# Patient Record
Sex: Male | Born: 1945
Health system: Southern US, Community
[De-identification: ages and names within clinical notes are randomized; demographics above are authoritative.]

## PROBLEM LIST (undated history)

## (undated) DIAGNOSIS — M1990 Unspecified osteoarthritis, unspecified site: Secondary | ICD-10-CM

## (undated) HISTORY — DX: Unspecified osteoarthritis, unspecified site: M19.90

---

## 2007-07-04 ENCOUNTER — Other Ambulatory Visit: Payer: Self-pay

## 2007-07-04 ENCOUNTER — Inpatient Hospital Stay: Payer: Self-pay | Admitting: Unknown Physician Specialty

## 2008-07-15 LAB — HM COLONOSCOPY

## 2009-05-27 IMAGING — NM NUCLEAR MEDICINE WHOLE BODY BONE SCINTIGRAPHY
4 series · 14 of 14 positions shown · non-contrast
Comparison: none

REASON FOR EXAM: Part 2
COMMENTS:  *** There is an active Isolation Contact on this patient. **

[Series 1000: 3 hr wholebody · 2.40mm/px · 2 of 2 frames shown]
[frame 1/2]
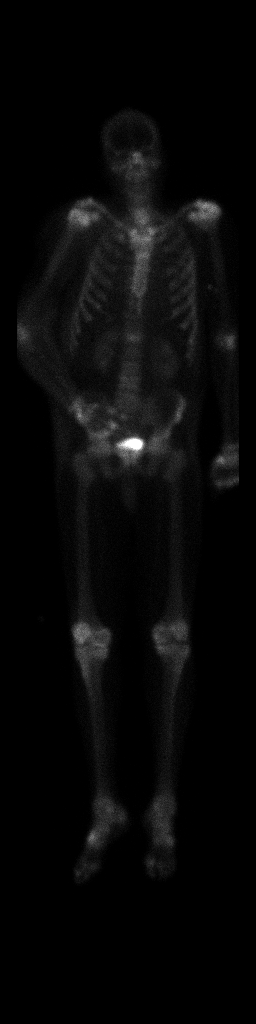
[frame 2/2]
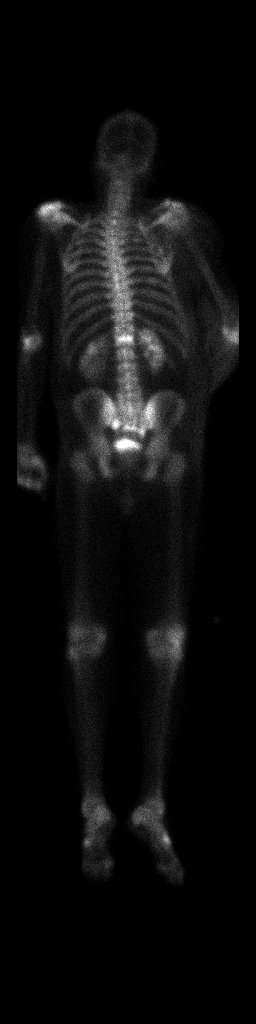

[Series 1000: statics · 2.40mm/px · 2 acquisitions, 4 frames shown]
[im 1/2]
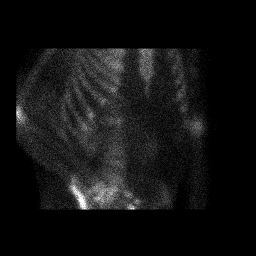
[im 1/2]
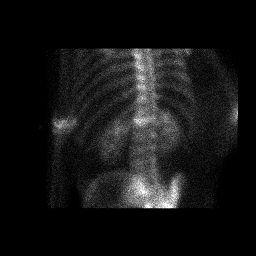
[im 2/2]
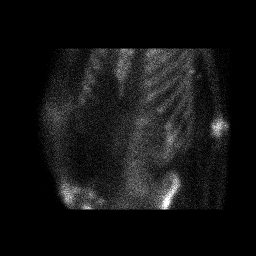
[im 2/2]
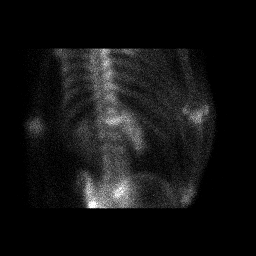

[Series 1000: immediate · 4.80mm/px · 2 of 2 frames shown]
[frame 1/2]
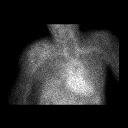
[frame 2/2]
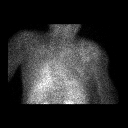

[Series 1000: flow · 4.80mm/px · 6 of 60 frames shown]
[frame 6/60]
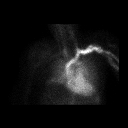
[frame 16/60]
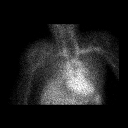
[frame 26/60]
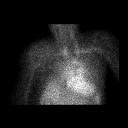
[frame 36/60]
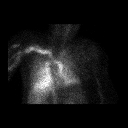
[frame 46/60]
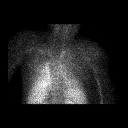
[frame 56/60]
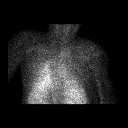

[14 of 14 positions shown; findings below may reference images not displayed]

PROCEDURE:     NM  - NM BONE WB 3 HR [DATE] [DATE]

RESULT:     Following intravenous administration of 21.09 mCi Technetium 99m
MDP, Three Phase Bone Scan of the shoulders was performed with a two hour
whole body scan obtained at three hours.

The three phase shoulder compartment shows symmetrical tracer activity in
the shoulders on the vascular and equilibrium phase views. On the delayed
scan, there is observed increased tracer activity on the LEFT in the region
of the acromion and lateral clavicle. The findings are nonspecific and could
be secondary to trauma or neoplasm. Infection is considered unlikely in view
of the absence of increased tracer activity on the arterial and equilibrium
phase views. There is a mild increase in tracer activity at the L1 level of
the lumbar spine. This finding is nonspecific. Degenerative change would be
the primary consideration but correlation with plain film radiographs would
be recommended. There is a very faint increase in tracer activity at the
costochondral junction of the first, RIGHT rib. This is thought to be of
doubtful clinical significance. Tracer activity is seen in both kidneys.
IMPRESSION: 1.  There is a mild increase in tracer activity at the LEFT shoulder in the
region of the acromion.
2.  There is increased tracer activity at the level of the L1 lumbar
vertebral body.
3.  There is a slight increase in tracer activity at the first, RIGHT rib
anteriorly.

## 2009-05-29 IMAGING — XA DG CHEST 1V
2 series · 2 of 2 positions shown · non-contrast
Comparison: none

REASON FOR EXAM: picc placement
COMMENTS:

PROCEDURE:     VAS - CHEST FRONTAL SINGLE VIEW  - July 09, 2007 [DATE]
RESULT:     AP chest was performed following placement of PICC line. The
PICC line is in good anatomic position.  The tip is in the region of the
superior vena cava.

[Series 1: run · 1 of 1 slices shown (1 of 2)]
[im 1/1]
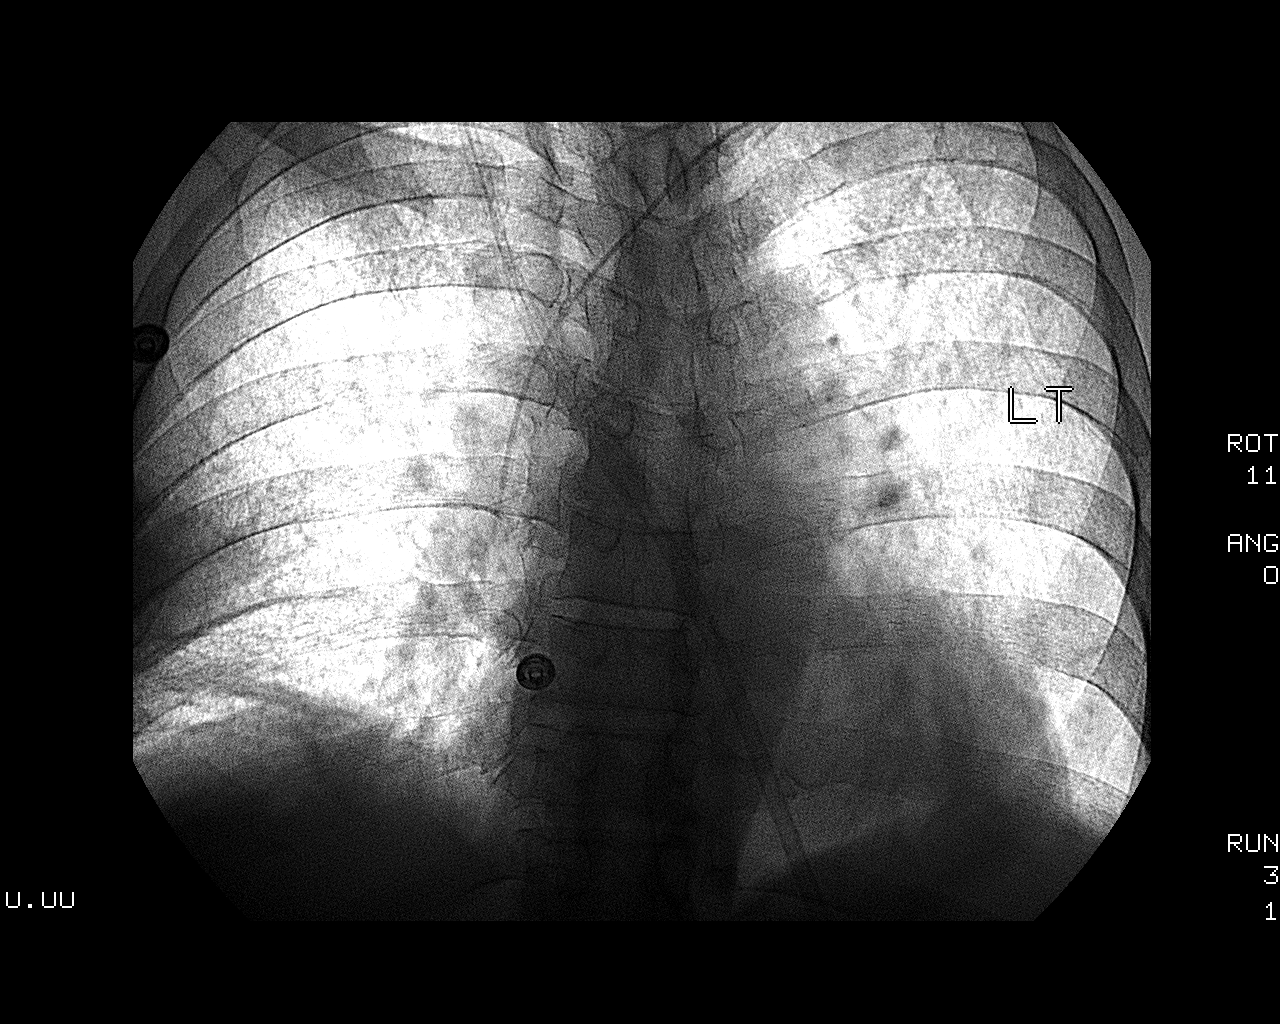

[Series 3: run · 1 of 1 slices shown (2 of 2)]
[im 1/1]
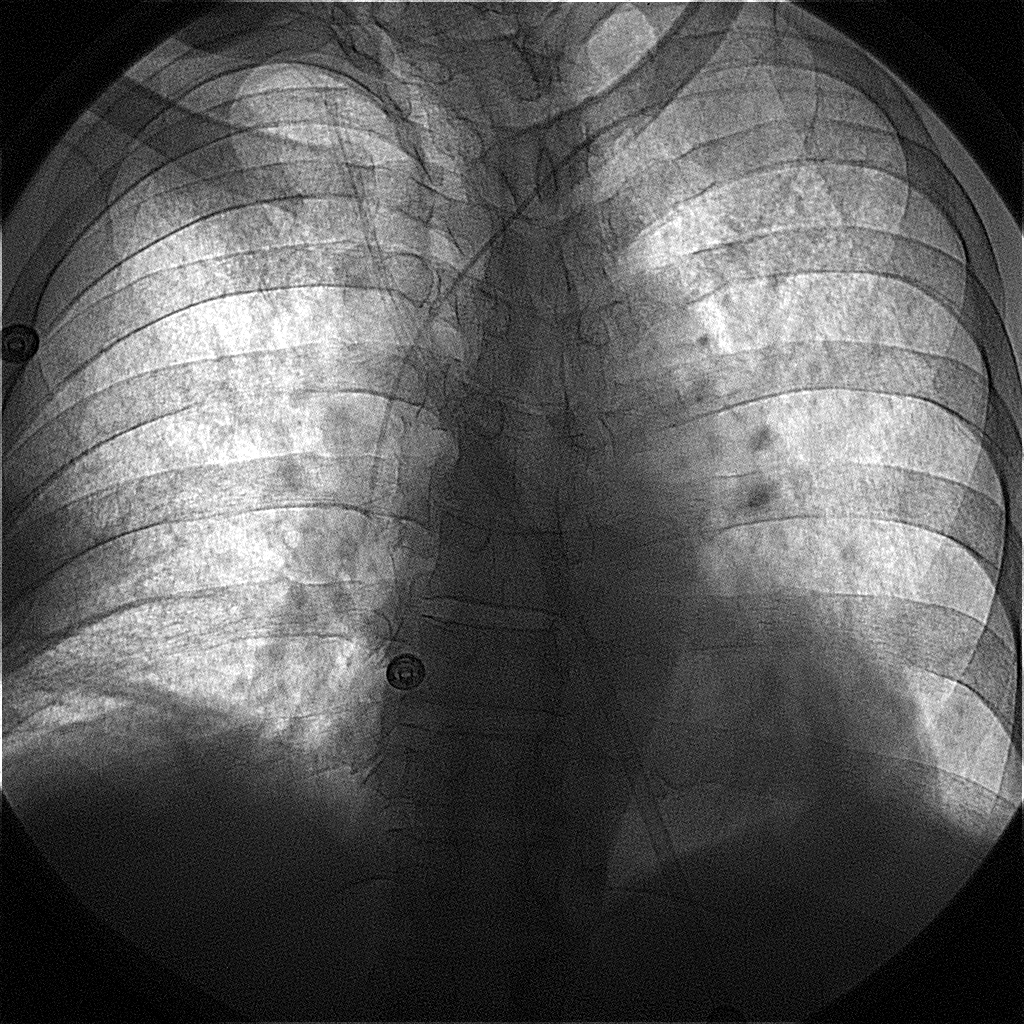

[2 of 2 positions shown; findings below may reference images not displayed]

IMPRESSION: 1)Good anatomic position of PICC line.

## 2012-09-30 DIAGNOSIS — N400 Enlarged prostate without lower urinary tract symptoms: Secondary | ICD-10-CM | POA: Insufficient documentation

## 2012-09-30 DIAGNOSIS — M009 Pyogenic arthritis, unspecified: Secondary | ICD-10-CM | POA: Insufficient documentation

## 2012-09-30 DIAGNOSIS — R972 Elevated prostate specific antigen [PSA]: Secondary | ICD-10-CM | POA: Insufficient documentation

## 2012-10-01 DIAGNOSIS — G8929 Other chronic pain: Secondary | ICD-10-CM | POA: Insufficient documentation

## 2013-10-05 DIAGNOSIS — F419 Anxiety disorder, unspecified: Secondary | ICD-10-CM | POA: Insufficient documentation

## 2014-12-14 DIAGNOSIS — I10 Essential (primary) hypertension: Secondary | ICD-10-CM | POA: Diagnosis not present

## 2014-12-14 DIAGNOSIS — E780 Pure hypercholesterolemia: Secondary | ICD-10-CM | POA: Diagnosis not present

## 2014-12-14 DIAGNOSIS — M1990 Unspecified osteoarthritis, unspecified site: Secondary | ICD-10-CM | POA: Diagnosis not present

## 2014-12-14 DIAGNOSIS — N400 Enlarged prostate without lower urinary tract symptoms: Secondary | ICD-10-CM | POA: Diagnosis not present

## 2014-12-14 LAB — CBC AND DIFFERENTIAL
HCT: 40 % — AB (ref 41–53)
Hemoglobin: 14.3 g/dL (ref 13.5–17.5)
Neutrophils Absolute: 5 /uL
Platelets: 220 10*3/uL (ref 150–399)
WBC: 8.1 10^3/mL

## 2014-12-14 LAB — BASIC METABOLIC PANEL
BUN: 22 mg/dL — AB (ref 4–21)
Creatinine: 1.3 mg/dL (ref <=1.3)
Glucose: 83 mg/dL

## 2014-12-14 LAB — PSA: PSA: 2.4

## 2014-12-14 LAB — HEPATIC FUNCTION PANEL
ALT: 21 U/L (ref 10–40)
AST: 22 U/L (ref 14–40)
Alkaline Phosphatase: 90 U/L (ref 25–125)
BILIRUBIN, TOTAL: 0.4 mg/dL

## 2014-12-14 LAB — LIPID PANEL
Cholesterol: 190 mg/dL (ref 0–200)
HDL: 69 mg/dL (ref 35–70)
LDL Cholesterol: 109 mg/dL
Triglycerides: 60 mg/dL (ref 40–160)

## 2014-12-29 DIAGNOSIS — L570 Actinic keratosis: Secondary | ICD-10-CM | POA: Diagnosis not present

## 2014-12-29 DIAGNOSIS — L578 Other skin changes due to chronic exposure to nonionizing radiation: Secondary | ICD-10-CM | POA: Diagnosis not present

## 2015-01-24 DIAGNOSIS — M1990 Unspecified osteoarthritis, unspecified site: Secondary | ICD-10-CM | POA: Diagnosis not present

## 2015-01-24 DIAGNOSIS — E780 Pure hypercholesterolemia: Secondary | ICD-10-CM | POA: Diagnosis not present

## 2015-01-24 DIAGNOSIS — N400 Enlarged prostate without lower urinary tract symptoms: Secondary | ICD-10-CM | POA: Diagnosis not present

## 2015-01-24 DIAGNOSIS — I10 Essential (primary) hypertension: Secondary | ICD-10-CM | POA: Diagnosis not present

## 2015-03-14 DIAGNOSIS — N401 Enlarged prostate with lower urinary tract symptoms: Secondary | ICD-10-CM

## 2015-03-14 DIAGNOSIS — I129 Hypertensive chronic kidney disease with stage 1 through stage 4 chronic kidney disease, or unspecified chronic kidney disease: Secondary | ICD-10-CM | POA: Insufficient documentation

## 2015-03-14 DIAGNOSIS — M159 Polyosteoarthritis, unspecified: Secondary | ICD-10-CM | POA: Insufficient documentation

## 2015-03-14 DIAGNOSIS — N138 Other obstructive and reflux uropathy: Secondary | ICD-10-CM | POA: Insufficient documentation

## 2015-03-14 DIAGNOSIS — I10 Essential (primary) hypertension: Secondary | ICD-10-CM

## 2015-03-14 DIAGNOSIS — L989 Disorder of the skin and subcutaneous tissue, unspecified: Secondary | ICD-10-CM | POA: Insufficient documentation

## 2015-03-14 DIAGNOSIS — E7800 Pure hypercholesterolemia, unspecified: Secondary | ICD-10-CM | POA: Insufficient documentation

## 2015-03-14 DIAGNOSIS — N183 Chronic kidney disease, stage 3 unspecified: Secondary | ICD-10-CM | POA: Insufficient documentation

## 2015-03-18 ENCOUNTER — Telehealth: Payer: Self-pay

## 2015-03-18 ENCOUNTER — Ambulatory Visit (INDEPENDENT_AMBULATORY_CARE_PROVIDER_SITE_OTHER): Payer: Commercial Managed Care - HMO | Admitting: Family Medicine

## 2015-03-18 ENCOUNTER — Telehealth: Payer: Self-pay | Admitting: Family Medicine

## 2015-03-18 ENCOUNTER — Encounter: Payer: Self-pay | Admitting: Family Medicine

## 2015-03-18 VITALS — BP 130/75 | HR 60 | Resp 16 | Ht 69.0 in | Wt 165.0 lb

## 2015-03-18 DIAGNOSIS — M1990 Unspecified osteoarthritis, unspecified site: Secondary | ICD-10-CM

## 2015-03-18 DIAGNOSIS — N400 Enlarged prostate without lower urinary tract symptoms: Secondary | ICD-10-CM

## 2015-03-18 DIAGNOSIS — E7800 Pure hypercholesterolemia, unspecified: Secondary | ICD-10-CM

## 2015-03-18 DIAGNOSIS — I10 Essential (primary) hypertension: Secondary | ICD-10-CM | POA: Diagnosis not present

## 2015-03-18 DIAGNOSIS — M25561 Pain in right knee: Secondary | ICD-10-CM

## 2015-03-18 MED ORDER — AMLODIPINE BESYLATE 5 MG PO TABS: 5.0000 mg | ORAL_TABLET | Freq: Every day | ORAL | Status: DC

## 2015-03-18 MED ORDER — ACETAMINOPHEN 500 MG PO TABS: 500.0000 mg | ORAL_TABLET | Freq: Three times a day (TID) | ORAL | Status: DC | PRN

## 2015-03-18 NOTE — Assessment & Plan Note (Signed)
Continue meds. 

## 2015-03-18 NOTE — Telephone Encounter (Signed)
referral is done and okay per Amy to orthopedic for knee pain

## 2015-03-18 NOTE — Telephone Encounter (Signed)
Will do ortho referral

## 2015-03-18 NOTE — Telephone Encounter (Signed)
error 

## 2015-03-18 NOTE — Assessment & Plan Note (Signed)
Do not use any NSAIDs because of mild renal insufficiency.  Take Tylenol 500 mg., 1-2 every 8 hours as needed.  Will plan Ortho referral at patient's discretion.

## 2015-03-18 NOTE — Telephone Encounter (Signed)
Pt. wife called requesting we  do a referrral to  orthro  for pt  knee (pt was in office this  morning)

## 2015-03-18 NOTE — Progress Notes (Signed)
Name: Jerry Simmons   MRN: [id number]    DOB: [birth year]   Date:03/18/2015       Progress Note  Subjective  Chief Complaint  Chief Complaint  Patient presents with  . Hypertension  . Hyperlipidemia  . Knee Pain  . Benign Prostatic Hypertrophy    HPI  -For f/u of HBP.  Has elevated lipids and BPH ands arthritis also.  Taking all meds (but leaves off Flomax occ.).  C/o p[in in knees sec. to his arthritis.  Somewhat upset that he should not take NSAIDs b/o mild renal insufficiency. Essential (primary) hypertension Continue meds.   Arthritis Do not use any NSAIDs because of mild renal insufficiency.  Take Tylenol 500 mg., 1-2 every 8 hours as needed.  Will plan Ortho referral at patient's discretion.   Enlarged prostate Continue meds.   Hypercholesteremia Continue meds.     Past Medical History  Diagnosis Date  . Hypertension   . Hyperlipidemia   . Arthritis     No past surgical history on file.  No family history on file.  History   Social History  . Marital Status: Married    Spouse Name: N/A  . Number of Children: N/A  . Years of Education: N/A   Occupational History  . Not on file.   Social History Main Topics  . Smoking status: Former Smoker -- 3.00 packs/day for 20 years    Types: Cigarettes    Quit date: 09/16/1985  . Smokeless tobacco: Never Used  . Alcohol Use: 0.6 oz/week    1 Glasses of wine per week     Comment: Only has wine on occassional.   . Drug Use: No  . Sexual Activity: Not on file   Other Topics Concern  . Not on file   Social History Narrative     Current outpatient prescriptions:  .  amLODipine (NORVASC) 5 MG tablet, Take 1 tablet (5 mg total) by mouth daily., Disp: 90 tablet, Rfl: 3 .  finasteride (PROSCAR) 5 MG tablet, Take 5 mg by mouth daily., Disp: , Rfl:  .  lisinopril (PRINIVIL,ZESTRIL) 20 MG tablet, Take 20 mg by mouth daily., Disp: , Rfl:  .  lovastatin (MEVACOR) 20 MG tablet, Take 20 mg by mouth at bedtime.,  Disp: , Rfl:  .  tamsulosin (FLOMAX) 0.4 MG CAPS capsule, Take 0.4 mg by mouth at bedtime., Disp: , Rfl:  .  acetaminophen (TYLENOL) 500 MG tablet, Take 1 tablet (500 mg total) by mouth every 8 (eight) hours as needed for moderate pain. Take 1 or 2 tabs at each dose.-OTC, Disp: 100 tablet, Rfl: 0  No Known Allergies   Review of Systems  Constitutional: Negative.   Respiratory: Negative.   Cardiovascular: Negative.   Gastrointestinal: Negative.   Genitourinary: Negative.   Musculoskeletal: Positive for joint pain (knees).  Skin: Negative.   Neurological: Negative.      Objective  Filed Vitals:   03/18/15 0901 03/18/15 0939  BP: 149/68 130/75  Pulse: 56 60  Resp: 16   Height: 5\' 9"  (1.753 m)   Weight: 165 lb (74.844 kg)     Physical Exam  Constitutional: He is well-developed, well-nourished, and in no distress. No distress.  Eyes: Conjunctivae are normal. Pupils are equal, round, and reactive to light. No scleral icterus.  Neck: Normal range of motion. Neck supple. No thyromegaly present.  Cardiovascular: Normal rate, regular rhythm, normal heart sounds and intact distal pulses.  Exam reveals no gallop and no friction rub.  No murmur heard. Pulmonary/Chest: Effort normal and breath sounds normal. No respiratory distress. He exhibits no tenderness.  Abdominal: Soft. Bowel sounds are normal.  Musculoskeletal:       Right knee: Tenderness found. Medial joint line tenderness noted.       Left knee: Tenderness found. Medial joint line tenderness noted.  Lymphadenopathy:    He has no cervical adenopathy.       No results found for this or any previous visit (from the past 2160 hour(s)).   Assessment & Plan  Problem List Items Addressed This Visit      Cardiovascular and Mediastinum   Essential (primary) hypertension - Primary    Continue meds.      Relevant Medications   amLODipine (NORVASC) 5 MG tablet     Musculoskeletal and Integument   Arthritis    Do not  use any NSAIDs because of mild renal insufficiency.  Take Tylenol 500 mg., 1-2 every 8 hours as needed.  Will plan Ortho referral at patient's discretion.      Relevant Medications   acetaminophen (TYLENOL) 500 MG tablet     Other   Enlarged prostate    Continue meds.      Hypercholesteremia    Continue meds.      Relevant Medications   amLODipine (NORVASC) 5 MG tablet      Meds ordered this encounter  Medications  . amLODipine (NORVASC) 5 MG tablet    Sig: Take 1 tablet (5 mg total) by mouth daily.    Dispense:  90 tablet    Refill:  3  . acetaminophen (TYLENOL) 500 MG tablet    Sig: Take 1 tablet (500 mg total) by mouth every 8 (eight) hours as needed for moderate pain. Take 1 or 2 tabs at each dose.-OTC    Dispense:  100 tablet    Refill:  0   1. Essential (primary) hypertension  - amLODipine (NORVASC) 5 MG tablet; Take 1 tablet (5 mg total) by mouth daily.  Dispense: 90 tablet; Refill: 3  2. Arthritis  3. Hypercholesteremia   4. Enlarged prostate

## 2015-04-01 DIAGNOSIS — M170 Bilateral primary osteoarthritis of knee: Secondary | ICD-10-CM | POA: Diagnosis not present

## 2015-06-21 ENCOUNTER — Telehealth: Payer: Self-pay | Admitting: Family Medicine

## 2015-06-21 NOTE — Telephone Encounter (Signed)
Pt wants a referral to Dr. Rhodia Albright (chiropractor) for a pinched nerve in back.  Please call [phone]

## 2015-06-22 NOTE — Telephone Encounter (Signed)
IS this something patient will need to see you for first? [facility]

## 2015-06-22 NOTE — Telephone Encounter (Signed)
If this is an official referral for insurance purposes, then I would need to see him to make the referral. -jh

## 2015-06-22 NOTE — Telephone Encounter (Signed)
Spoke to patient wife who will relay message. Pt needs appt before official referral.

## 2015-06-24 ENCOUNTER — Encounter: Payer: Self-pay | Admitting: Family Medicine

## 2015-06-24 ENCOUNTER — Ambulatory Visit (INDEPENDENT_AMBULATORY_CARE_PROVIDER_SITE_OTHER): Payer: Commercial Managed Care - HMO | Admitting: Family Medicine

## 2015-06-24 VITALS — BP 130/70 | HR 72 | Temp 97.6°F | Resp 16 | Ht 69.0 in | Wt 167.0 lb

## 2015-06-24 DIAGNOSIS — M5442 Lumbago with sciatica, left side: Secondary | ICD-10-CM

## 2015-06-24 DIAGNOSIS — Z23 Encounter for immunization: Secondary | ICD-10-CM

## 2015-06-24 DIAGNOSIS — Z283 Underimmunization status: Secondary | ICD-10-CM

## 2015-06-24 DIAGNOSIS — M129 Arthropathy, unspecified: Secondary | ICD-10-CM | POA: Diagnosis not present

## 2015-06-24 DIAGNOSIS — M1711 Unilateral primary osteoarthritis, right knee: Secondary | ICD-10-CM

## 2015-06-24 DIAGNOSIS — I10 Essential (primary) hypertension: Secondary | ICD-10-CM

## 2015-06-24 DIAGNOSIS — Z2839 Other underimmunization status: Secondary | ICD-10-CM

## 2015-06-24 NOTE — Patient Instructions (Addendum)
Cont. To use Amlodipine only for  BP.  Leave off Lisinopril.  Cont. Other meds.

## 2015-06-24 NOTE — Progress Notes (Signed)
Name: Jerry Simmons   MRN: [id number]    DOB: [birth year]   Date:06/24/2015       Progress Note  Subjective  Chief Complaint  Chief Complaint  Patient presents with  . Back Pain    hurts while standing and onset 6 month getting worst and painful to standstill needs referral for chriopractor    HPI  States that has back pain when standing still x 6-8 mos.   Pain in L. Lower back.  Wants to go to [employer], [place].  He hs seen him before.  No trauma or acute injury.  Has had some back pain on and off in past.  Has some knee pain again.  Wants to see Dr. Reita Chard again to get "shot" in R. Knee.  Last injection healed for 2-3 mos.  States that BP this summer was low, so he stopped his Lisinopril for past 2 mos.  BPs at home running, 110--130 sys.  Since stopping Lisinopril.  Still taking Amlodipine.  No problem-specific assessment & plan notes found for this encounter.   Past Medical History  Diagnosis Date  . Hypertension   . Hyperlipidemia   . Arthritis     Social History  Substance Use Topics  . Smoking status: Former Smoker -- 3.00 packs/day for 20 years    Types: Cigarettes    Quit date: 09/16/1985  . Smokeless tobacco: Never Used  . Alcohol Use: 0.6 oz/week    1 Glasses of wine per week     Comment: Only has wine on occassional.      Current outpatient prescriptions:  .  acetaminophen (TYLENOL) 500 MG tablet, Take 1 tablet (500 mg total) by mouth every 8 (eight) hours as needed for moderate pain. Take 1 or 2 tabs at each dose.-OTC, Disp: 100 tablet, Rfl: 0 .  amLODipine (NORVASC) 5 MG tablet, Take 1 tablet (5 mg total) by mouth daily., Disp: 90 tablet, Rfl: 3 .  finasteride (PROSCAR) 5 MG tablet, Take 5 mg by mouth daily., Disp: , Rfl:  .  lovastatin (MEVACOR) 20 MG tablet, Take 20 mg by mouth at bedtime., Disp: , Rfl:  .  meloxicam (MOBIC) 15 MG tablet, , Disp: , Rfl:  .  tamsulosin (FLOMAX) 0.4 MG CAPS capsule, Take 0.4 mg by mouth at bedtime., Disp: , Rfl:   .  lisinopril (PRINIVIL,ZESTRIL) 20 MG tablet, Take 20 mg by mouth daily., Disp: , Rfl:   No Known Allergies  Review of Systems  Constitutional: Negative for fever, chills, weight loss and malaise/fatigue.  HENT: Negative for hearing loss.   Eyes: Negative for blurred vision and double vision.  Respiratory: Negative for cough, sputum production, shortness of breath and wheezing.   Cardiovascular: Negative for chest pain, palpitations, orthopnea and leg swelling.  Gastrointestinal: Negative for heartburn, nausea, vomiting, abdominal pain, diarrhea and blood in stool.  Genitourinary: Negative for dysuria, urgency and frequency.  Musculoskeletal: Positive for back pain (L. lower back with sl. sciatica.) and joint pain (R. knee).  Skin: Negative for rash.  Neurological: Negative for dizziness, sensory change, focal weakness, weakness and headaches.      Objective  Filed Vitals:   06/24/15 1440 06/24/15 1507  BP: 130/69 130/70  Pulse: 72   Temp: 97.6 F (36.4 C)   TempSrc: Oral   Resp: 16   Height: 5\' 9"  (1.753 m)   Weight: 167 lb (75.751 kg)      Physical Exam  Constitutional: He is well-developed, well-nourished, and in no distress. No  distress.  HENT:  Head: Normocephalic and atraumatic.  Neck: Normal range of motion. Neck supple. Carotid bruit is not present. No thyromegaly present.  Cardiovascular: Normal rate, normal heart sounds and intact distal pulses.  Frequent extrasystoles are present. Exam reveals no gallop and no friction rub.   No murmur heard. Pulmonary/Chest: Effort normal and breath sounds normal. No respiratory distress. He has no wheezes. He has no rales.  Musculoskeletal:  Bilateral knees with osteoarthritis changes with pain to ROM of R.  L. Low back with pain reported in L. S-I joint area.  No neuro sx.  Lymphadenopathy:    He has no cervical adenopathy.  Vitals reviewed.     No results found for this or any previous visit (from the past 2160  hour(s)).   Assessment & Plan  1. Left-sided low back pain with left-sided sciatica  - Ambulatory referral to Physical Therapy  2. Essential (primary) hypertension   3. Arthritis of knee, right  - Ambulatory referral to Orthopedic Surgery

## 2015-06-27 ENCOUNTER — Telehealth: Payer: Self-pay | Admitting: *Deleted

## 2015-06-27 NOTE — Telephone Encounter (Signed)
Pt has appt Tues 06/28/15 @ 8:30 Tim Arna Snipe location. PA pending. Pt has appt Wed 06/29/15 @ 10:15 arrival 10:00 [facility].

## 2015-06-28 DIAGNOSIS — M9904 Segmental and somatic dysfunction of sacral region: Secondary | ICD-10-CM | POA: Diagnosis not present

## 2015-06-28 DIAGNOSIS — M9905 Segmental and somatic dysfunction of pelvic region: Secondary | ICD-10-CM | POA: Diagnosis not present

## 2015-06-28 DIAGNOSIS — M62830 Muscle spasm of back: Secondary | ICD-10-CM | POA: Diagnosis not present

## 2015-06-28 DIAGNOSIS — M5432 Sciatica, left side: Secondary | ICD-10-CM | POA: Diagnosis not present

## 2015-06-28 DIAGNOSIS — M9903 Segmental and somatic dysfunction of lumbar region: Secondary | ICD-10-CM | POA: Diagnosis not present

## 2015-06-28 DIAGNOSIS — M5416 Radiculopathy, lumbar region: Secondary | ICD-10-CM | POA: Diagnosis not present

## 2015-06-29 DIAGNOSIS — M9905 Segmental and somatic dysfunction of pelvic region: Secondary | ICD-10-CM | POA: Diagnosis not present

## 2015-06-29 DIAGNOSIS — M5432 Sciatica, left side: Secondary | ICD-10-CM | POA: Diagnosis not present

## 2015-06-29 DIAGNOSIS — M62830 Muscle spasm of back: Secondary | ICD-10-CM | POA: Diagnosis not present

## 2015-06-29 DIAGNOSIS — M9904 Segmental and somatic dysfunction of sacral region: Secondary | ICD-10-CM | POA: Diagnosis not present

## 2015-06-29 DIAGNOSIS — M5416 Radiculopathy, lumbar region: Secondary | ICD-10-CM | POA: Diagnosis not present

## 2015-06-29 DIAGNOSIS — M9903 Segmental and somatic dysfunction of lumbar region: Secondary | ICD-10-CM | POA: Diagnosis not present

## 2015-07-01 DIAGNOSIS — M9905 Segmental and somatic dysfunction of pelvic region: Secondary | ICD-10-CM | POA: Diagnosis not present

## 2015-07-01 DIAGNOSIS — M5416 Radiculopathy, lumbar region: Secondary | ICD-10-CM | POA: Diagnosis not present

## 2015-07-01 DIAGNOSIS — M9904 Segmental and somatic dysfunction of sacral region: Secondary | ICD-10-CM | POA: Diagnosis not present

## 2015-07-01 DIAGNOSIS — M9903 Segmental and somatic dysfunction of lumbar region: Secondary | ICD-10-CM | POA: Diagnosis not present

## 2015-07-01 DIAGNOSIS — M5432 Sciatica, left side: Secondary | ICD-10-CM | POA: Diagnosis not present

## 2015-07-01 DIAGNOSIS — M62830 Muscle spasm of back: Secondary | ICD-10-CM | POA: Diagnosis not present

## 2015-07-04 DIAGNOSIS — M9903 Segmental and somatic dysfunction of lumbar region: Secondary | ICD-10-CM | POA: Diagnosis not present

## 2015-07-04 DIAGNOSIS — M5416 Radiculopathy, lumbar region: Secondary | ICD-10-CM | POA: Diagnosis not present

## 2015-07-04 DIAGNOSIS — M5432 Sciatica, left side: Secondary | ICD-10-CM | POA: Diagnosis not present

## 2015-07-04 DIAGNOSIS — M62830 Muscle spasm of back: Secondary | ICD-10-CM | POA: Diagnosis not present

## 2015-07-04 DIAGNOSIS — M9904 Segmental and somatic dysfunction of sacral region: Secondary | ICD-10-CM | POA: Diagnosis not present

## 2015-07-04 DIAGNOSIS — M9905 Segmental and somatic dysfunction of pelvic region: Secondary | ICD-10-CM | POA: Diagnosis not present

## 2015-07-06 DIAGNOSIS — M9905 Segmental and somatic dysfunction of pelvic region: Secondary | ICD-10-CM | POA: Diagnosis not present

## 2015-07-06 DIAGNOSIS — M9904 Segmental and somatic dysfunction of sacral region: Secondary | ICD-10-CM | POA: Diagnosis not present

## 2015-07-06 DIAGNOSIS — M9903 Segmental and somatic dysfunction of lumbar region: Secondary | ICD-10-CM | POA: Diagnosis not present

## 2015-07-06 DIAGNOSIS — M5432 Sciatica, left side: Secondary | ICD-10-CM | POA: Diagnosis not present

## 2015-07-06 DIAGNOSIS — M62830 Muscle spasm of back: Secondary | ICD-10-CM | POA: Diagnosis not present

## 2015-07-06 DIAGNOSIS — M5416 Radiculopathy, lumbar region: Secondary | ICD-10-CM | POA: Diagnosis not present

## 2015-07-08 DIAGNOSIS — M9905 Segmental and somatic dysfunction of pelvic region: Secondary | ICD-10-CM | POA: Diagnosis not present

## 2015-07-08 DIAGNOSIS — M5432 Sciatica, left side: Secondary | ICD-10-CM | POA: Diagnosis not present

## 2015-07-08 DIAGNOSIS — M62830 Muscle spasm of back: Secondary | ICD-10-CM | POA: Diagnosis not present

## 2015-07-08 DIAGNOSIS — M5416 Radiculopathy, lumbar region: Secondary | ICD-10-CM | POA: Diagnosis not present

## 2015-07-08 DIAGNOSIS — M9903 Segmental and somatic dysfunction of lumbar region: Secondary | ICD-10-CM | POA: Diagnosis not present

## 2015-07-08 DIAGNOSIS — M9904 Segmental and somatic dysfunction of sacral region: Secondary | ICD-10-CM | POA: Diagnosis not present

## 2015-07-11 ENCOUNTER — Telehealth: Payer: Self-pay | Admitting: Family Medicine

## 2015-07-11 NOTE — Telephone Encounter (Signed)
Jerry Simmons # [id number] Dr. Trilby Drummer office.

## 2015-07-11 NOTE — Telephone Encounter (Signed)
Pt called need  Authorization he have used up the 6 visit    Beshel ----- April  Beshel [phone]

## 2015-07-12 DIAGNOSIS — M9904 Segmental and somatic dysfunction of sacral region: Secondary | ICD-10-CM | POA: Diagnosis not present

## 2015-07-12 DIAGNOSIS — M5416 Radiculopathy, lumbar region: Secondary | ICD-10-CM | POA: Diagnosis not present

## 2015-07-12 DIAGNOSIS — M62830 Muscle spasm of back: Secondary | ICD-10-CM | POA: Diagnosis not present

## 2015-07-12 DIAGNOSIS — M9905 Segmental and somatic dysfunction of pelvic region: Secondary | ICD-10-CM | POA: Diagnosis not present

## 2015-07-12 DIAGNOSIS — M9903 Segmental and somatic dysfunction of lumbar region: Secondary | ICD-10-CM | POA: Diagnosis not present

## 2015-07-12 DIAGNOSIS — M5432 Sciatica, left side: Secondary | ICD-10-CM | POA: Diagnosis not present

## 2015-07-15 DIAGNOSIS — M5416 Radiculopathy, lumbar region: Secondary | ICD-10-CM | POA: Diagnosis not present

## 2015-07-15 DIAGNOSIS — M5432 Sciatica, left side: Secondary | ICD-10-CM | POA: Diagnosis not present

## 2015-07-15 DIAGNOSIS — M62830 Muscle spasm of back: Secondary | ICD-10-CM | POA: Diagnosis not present

## 2015-07-15 DIAGNOSIS — M9903 Segmental and somatic dysfunction of lumbar region: Secondary | ICD-10-CM | POA: Diagnosis not present

## 2015-07-15 DIAGNOSIS — M9905 Segmental and somatic dysfunction of pelvic region: Secondary | ICD-10-CM | POA: Diagnosis not present

## 2015-07-15 DIAGNOSIS — M9904 Segmental and somatic dysfunction of sacral region: Secondary | ICD-10-CM | POA: Diagnosis not present

## 2015-07-18 DIAGNOSIS — M9904 Segmental and somatic dysfunction of sacral region: Secondary | ICD-10-CM | POA: Diagnosis not present

## 2015-07-18 DIAGNOSIS — M5416 Radiculopathy, lumbar region: Secondary | ICD-10-CM | POA: Diagnosis not present

## 2015-07-18 DIAGNOSIS — M62830 Muscle spasm of back: Secondary | ICD-10-CM | POA: Diagnosis not present

## 2015-07-18 DIAGNOSIS — M9905 Segmental and somatic dysfunction of pelvic region: Secondary | ICD-10-CM | POA: Diagnosis not present

## 2015-07-18 DIAGNOSIS — M9903 Segmental and somatic dysfunction of lumbar region: Secondary | ICD-10-CM | POA: Diagnosis not present

## 2015-07-18 DIAGNOSIS — M5432 Sciatica, left side: Secondary | ICD-10-CM | POA: Diagnosis not present

## 2015-07-22 ENCOUNTER — Ambulatory Visit: Payer: Commercial Managed Care - HMO | Admitting: Family Medicine

## 2015-07-22 DIAGNOSIS — M9905 Segmental and somatic dysfunction of pelvic region: Secondary | ICD-10-CM | POA: Diagnosis not present

## 2015-07-22 DIAGNOSIS — M5416 Radiculopathy, lumbar region: Secondary | ICD-10-CM | POA: Diagnosis not present

## 2015-07-22 DIAGNOSIS — M5432 Sciatica, left side: Secondary | ICD-10-CM | POA: Diagnosis not present

## 2015-07-22 DIAGNOSIS — M9904 Segmental and somatic dysfunction of sacral region: Secondary | ICD-10-CM | POA: Diagnosis not present

## 2015-07-22 DIAGNOSIS — M9903 Segmental and somatic dysfunction of lumbar region: Secondary | ICD-10-CM | POA: Diagnosis not present

## 2015-07-22 DIAGNOSIS — M62830 Muscle spasm of back: Secondary | ICD-10-CM | POA: Diagnosis not present

## 2015-07-29 DIAGNOSIS — M170 Bilateral primary osteoarthritis of knee: Secondary | ICD-10-CM | POA: Diagnosis not present

## 2015-09-15 ENCOUNTER — Other Ambulatory Visit: Payer: Self-pay | Admitting: Family Medicine

## 2015-10-05 ENCOUNTER — Ambulatory Visit: Payer: Commercial Managed Care - HMO | Admitting: Family Medicine

## 2015-10-25 ENCOUNTER — Other Ambulatory Visit: Payer: Self-pay

## 2015-10-25 ENCOUNTER — Encounter: Payer: Self-pay | Admitting: Family Medicine

## 2015-10-25 ENCOUNTER — Ambulatory Visit (INDEPENDENT_AMBULATORY_CARE_PROVIDER_SITE_OTHER): Payer: Commercial Managed Care - HMO | Admitting: Family Medicine

## 2015-10-25 VITALS — BP 130/65 | HR 73 | Resp 16 | Wt 171.0 lb

## 2015-10-25 DIAGNOSIS — I10 Essential (primary) hypertension: Secondary | ICD-10-CM | POA: Diagnosis not present

## 2015-10-25 DIAGNOSIS — M1990 Unspecified osteoarthritis, unspecified site: Secondary | ICD-10-CM | POA: Diagnosis not present

## 2015-10-25 DIAGNOSIS — E7800 Pure hypercholesterolemia, unspecified: Secondary | ICD-10-CM | POA: Diagnosis not present

## 2015-10-25 DIAGNOSIS — N400 Enlarged prostate without lower urinary tract symptoms: Secondary | ICD-10-CM

## 2015-10-25 DIAGNOSIS — R972 Elevated prostate specific antigen [PSA]: Secondary | ICD-10-CM

## 2015-10-25 DIAGNOSIS — G8929 Other chronic pain: Secondary | ICD-10-CM | POA: Diagnosis not present

## 2015-10-25 NOTE — Progress Notes (Signed)
Name: Jerry Simmons   MRN: [id number]    DOB: Sep 01, 1946   Date:10/25/2015       Progress Note  Subjective  Chief Complaint  Chief Complaint  Patient presents with  . Annual Exam    HPI Here for f/u of HBP.  Has arthritis. Has had some renal insufficiency.  He is taking Mobic every day plus Tylenol.  Needs CP soon.  No problem-specific assessment & plan notes found for this encounter.   Past Medical History  Diagnosis Date  . Hypertension   . Hyperlipidemia   . Arthritis     History reviewed. No pertinent past surgical history.  History reviewed. No pertinent family history.  Social History   Social History  . Marital Status: Married    Spouse Name: N/A  . Number of Children: N/A  . Years of Education: N/A   Occupational History  . Not on file.   Social History Main Topics  . Smoking status: Former Smoker -- 3.00 packs/day for 20 years    Types: Cigarettes    Quit date: 09/16/1985  . Smokeless tobacco: Never Used  . Alcohol Use: 0.6 oz/week    1 Glasses of wine per week     Comment: Only has wine on occassional.   . Drug Use: No  . Sexual Activity: Not on file   Other Topics Concern  . Not on file   Social History Narrative     Current outpatient prescriptions:  .  acetaminophen (TYLENOL) 500 MG tablet, Take 1 tablet (500 mg total) by mouth every 8 (eight) hours as needed for moderate pain. Take 1 or 2 tabs at each dose.-OTC, Disp: 100 tablet, Rfl: 0 .  amLODipine (NORVASC) 5 MG tablet, Take 1 tablet (5 mg total) by mouth daily., Disp: 90 tablet, Rfl: 3 .  finasteride (PROSCAR) 5 MG tablet, TAKE 1 TABLET EVERY DAY, Disp: 90 tablet, Rfl: 3 .  lisinopril (PRINIVIL,ZESTRIL) 20 MG tablet, TAKE 1 TABLET EVERY DAY, Disp: 90 tablet, Rfl: 3 .  lovastatin (MEVACOR) 20 MG tablet, TAKE 1 TABLET EVERY DAY, Disp: 90 tablet, Rfl: 3 .  meloxicam (MOBIC) 15 MG tablet, TAKE 1 TABLET EVERY DAY, Disp: 90 tablet, Rfl: 3 .  Phenylephrine-Acetaminophen 5-325 MG CAPS, , Disp:  , Rfl:  .  tamsulosin (FLOMAX) 0.4 MG CAPS capsule, TAKE 1 CAPSULE AT BEDTIME, Disp: 90 capsule, Rfl: 3 .  Vitamins A & D 5000-400 units CAPS, , Disp: , Rfl:   No Known Allergies   Review of Systems  Constitutional: Negative for fever, chills, weight loss and malaise/fatigue.  HENT: Positive for congestion. Negative for hearing loss.   Eyes: Negative for blurred vision and double vision.  Respiratory: Positive for cough (with a cold). Negative for shortness of breath and wheezing.   Cardiovascular: Negative for chest pain, palpitations and leg swelling.  Gastrointestinal: Negative for heartburn, abdominal pain and blood in stool.  Genitourinary: Negative for dysuria, urgency and frequency.  Musculoskeletal: Positive for joint pain (Fingers, knees, feet.).  Skin: Negative for itching and rash.  Neurological: Negative for dizziness, tremors, weakness and headaches.  Psychiatric/Behavioral: Negative for depression.      Objective  Filed Vitals:   10/25/15 1258 10/25/15 1336  BP: 132/70 130/65  Pulse: 73   Resp: 16   Weight: 171 lb (77.565 kg)     Physical Exam  Constitutional: He is oriented to person, place, and time and well-developed, well-nourished, and in no distress. No distress.  HENT:  Head: Normocephalic and atraumatic.  Eyes: Conjunctivae and EOM are normal. No scleral icterus.  Neck: Normal range of motion. Neck supple. Carotid bruit is not present. No thyromegaly present.  Cardiovascular: Normal rate, regular rhythm and normal heart sounds.  Exam reveals no gallop and no friction rub.   Pulmonary/Chest: Effort normal and breath sounds normal. No respiratory distress. He has no wheezes. He has no rales.  Abdominal: Soft. Bowel sounds are normal.  Musculoskeletal: He exhibits no edema.  arhtritic changes of fingers and knees  Lymphadenopathy:    He has no cervical adenopathy.  Neurological: He is alert and oriented to person, place, and time.  Vitals  reviewed.      No results found for this or any previous visit (from the past 2160 hour(s)).   Assessment & Plan  Problem List Items Addressed This Visit      Cardiovascular and Mediastinum   Essential (primary) hypertension - Primary   Relevant Orders   Comprehensive Metabolic Panel (CMET)     Musculoskeletal and Integument   Arthritis   Relevant Orders   CBC with Differential     Genitourinary   Benign fibroma of prostate   Relevant Orders   PSA     Other   Hypercholesteremia   Relevant Orders   Lipid Profile   Chronic pain   Abnormal prostate specific antigen   Relevant Orders   PSA      No orders of the defined types were placed in this encounter.   1. Essential (primary) hypertension Cont. meds - Comprehensive Metabolic Panel (CMET)  2. Arthritis Cont. meds - CBC with Differential  3. Benign fibroma of prostate Cont. meds - PSA  4. Hypercholesteremia Cont. meds- Lipid Profile  5. Chronic pain   6. Abnormal prostate specific antigen  - PSA

## 2015-10-26 DIAGNOSIS — M1990 Unspecified osteoarthritis, unspecified site: Secondary | ICD-10-CM | POA: Diagnosis not present

## 2015-10-26 DIAGNOSIS — E7800 Pure hypercholesterolemia, unspecified: Secondary | ICD-10-CM | POA: Diagnosis not present

## 2015-10-26 DIAGNOSIS — N400 Enlarged prostate without lower urinary tract symptoms: Secondary | ICD-10-CM | POA: Diagnosis not present

## 2015-10-26 DIAGNOSIS — R972 Elevated prostate specific antigen [PSA]: Secondary | ICD-10-CM | POA: Diagnosis not present

## 2015-10-26 DIAGNOSIS — I10 Essential (primary) hypertension: Secondary | ICD-10-CM | POA: Diagnosis not present

## 2015-10-26 LAB — CBC WITH DIFFERENTIAL/PLATELET
BASOS: 0 %
Basophils Absolute: 0 10*3/uL (ref 0.0–0.2)
EOS (ABSOLUTE): 0.3 10*3/uL (ref 0.0–0.4)
EOS: 5 %
HEMATOCRIT: 43 % (ref 37.5–51.0)
HEMOGLOBIN: 15.1 g/dL (ref 12.6–17.7)
IMMATURE GRANULOCYTES: 0 %
Immature Grans (Abs): 0 10*3/uL (ref 0.0–0.1)
Lymphocytes Absolute: 1.5 10*3/uL (ref 0.7–3.1)
Lymphs: 23 %
MCH: 30.6 pg (ref 26.6–33.0)
MCHC: 35.1 g/dL (ref 31.5–35.7)
MCV: 87 fL (ref 79–97)
MONOCYTES: 6 %
MONOS ABS: 0.4 10*3/uL (ref 0.1–0.9)
NEUTROS PCT: 66 %
Neutrophils Absolute: 4.4 10*3/uL (ref 1.4–7.0)
Platelets: 261 10*3/uL (ref 150–379)
RBC: 4.94 x10E6/uL (ref 4.14–5.80)
RDW: 13.2 % (ref 12.3–15.4)
WBC: 6.6 10*3/uL (ref 3.4–10.8)

## 2015-10-27 LAB — LIPID PANEL
CHOL/HDL RATIO: 2.7 ratio (ref 0.0–5.0)
Cholesterol, Total: 224 mg/dL — ABNORMAL HIGH (ref 100–199)
HDL: 83 mg/dL (ref >39)
LDL Calculated: 127 mg/dL — ABNORMAL HIGH (ref 0–99)
TRIGLYCERIDES: 68 mg/dL (ref 0–149)
VLDL CHOLESTEROL CAL: 14 mg/dL (ref 5–40)

## 2015-10-27 LAB — COMPREHENSIVE METABOLIC PANEL
A/G RATIO: 1.8 (ref 1.1–2.5)
ALBUMIN: 4.4 g/dL (ref 3.6–4.8)
ALK PHOS: 94 IU/L (ref 39–117)
ALT: 20 IU/L (ref 0–44)
AST: 18 IU/L (ref 0–40)
BILIRUBIN TOTAL: 0.5 mg/dL (ref 0.0–1.2)
BUN / CREAT RATIO: 21 (ref 10–22)
BUN: 24 mg/dL (ref 8–27)
CHLORIDE: 100 mmol/L (ref 96–106)
CO2: 21 mmol/L (ref 18–29)
Calcium: 9.8 mg/dL (ref 8.6–10.2)
Creatinine, Ser: 1.14 mg/dL (ref 0.76–1.27)
GFR calc Af Amer: 76 mL/min/{1.73_m2} (ref >59)
GFR calc non Af Amer: 66 mL/min/{1.73_m2} (ref >59)
GLUCOSE: 85 mg/dL (ref 65–99)
Globulin, Total: 2.5 g/dL (ref 1.5–4.5)
POTASSIUM: 4.7 mmol/L (ref 3.5–5.2)
Sodium: 140 mmol/L (ref 134–144)
Total Protein: 6.9 g/dL (ref 6.0–8.5)

## 2015-10-27 LAB — PSA: PROSTATE SPECIFIC AG, SERUM: 2.5 ng/mL (ref 0.0–4.0)

## 2015-11-16 ENCOUNTER — Other Ambulatory Visit: Payer: Self-pay | Admitting: Family Medicine

## 2015-11-28 ENCOUNTER — Ambulatory Visit (INDEPENDENT_AMBULATORY_CARE_PROVIDER_SITE_OTHER): Payer: Commercial Managed Care - HMO | Admitting: Family Medicine

## 2015-11-28 ENCOUNTER — Encounter: Payer: Self-pay | Admitting: Family Medicine

## 2015-11-28 VITALS — BP 136/75 | HR 61 | Resp 16 | Ht 69.0 in | Wt 173.6 lb

## 2015-11-28 DIAGNOSIS — M1990 Unspecified osteoarthritis, unspecified site: Secondary | ICD-10-CM

## 2015-11-28 DIAGNOSIS — I10 Essential (primary) hypertension: Secondary | ICD-10-CM

## 2015-11-28 DIAGNOSIS — G8929 Other chronic pain: Secondary | ICD-10-CM | POA: Diagnosis not present

## 2015-11-28 DIAGNOSIS — N400 Enlarged prostate without lower urinary tract symptoms: Secondary | ICD-10-CM

## 2015-11-28 DIAGNOSIS — E7800 Pure hypercholesterolemia, unspecified: Secondary | ICD-10-CM | POA: Diagnosis not present

## 2015-11-28 DIAGNOSIS — Z0000 Encounter for general adult medical examination without abnormal findings: Secondary | ICD-10-CM | POA: Diagnosis not present

## 2015-11-28 MED ORDER — LOVASTATIN 20 MG PO TABS: ORAL_TABLET | ORAL | Status: DC

## 2015-11-28 NOTE — Patient Instructions (Signed)
Repeat lipid panel and CMP on return

## 2015-11-28 NOTE — Progress Notes (Signed)
Name: Jerry Simmons   MRN: [id number]    DOB: 08-21-1946   Date:11/28/2015       Progress Note  Subjective  Chief Complaint  Chief Complaint  Patient presents with  . Annual Exam    HPI Here for annual health maintenance examination.   He has HBP, BPH, elevated lipids, arthritis.  Doing well overall .  Taking all his meds .  Had labs done last month.   All good.  PSA is 2.5 (stable). No problem-specific assessment & plan notes found for this encounter.   Past Medical History  Diagnosis Date  . Hypertension   . Hyperlipidemia   . Arthritis     History reviewed. No pertinent past surgical history.  History reviewed. No pertinent family history.  Social History   Social History  . Marital Status: Married    Spouse Name: N/A  . Number of Children: N/A  . Years of Education: N/A   Occupational History  . Not on file.   Social History Main Topics  . Smoking status: Former Smoker -- 3.00 packs/day for 20 years    Types: Cigarettes    Quit date: 09/16/1985  . Smokeless tobacco: Never Used  . Alcohol Use: 0.6 oz/week    1 Glasses of wine per week     Comment: Only has wine on occassional.   . Drug Use: No  . Sexual Activity: Not on file   Other Topics Concern  . Not on file   Social History Narrative     Current outpatient prescriptions:  .  acetaminophen (TYLENOL) 500 MG tablet, Take 1 tablet (500 mg total) by mouth every 8 (eight) hours as needed for moderate pain. Take 1 or 2 tabs at each dose.-OTC, Disp: 100 tablet, Rfl: 0 .  amLODipine (NORVASC) 5 MG tablet, TAKE 1 TABLET EVERY DAY, Disp: 90 tablet, Rfl: 3 .  finasteride (PROSCAR) 5 MG tablet, , Disp: , Rfl:  .  lisinopril (PRINIVIL,ZESTRIL) 20 MG tablet, TAKE 1 TABLET EVERY DAY, Disp: 90 tablet, Rfl: 3 .  lovastatin (MEVACOR) 20 MG tablet, Take one tablet every other night., Disp: 90 tablet, Rfl: 3 .  meloxicam (MOBIC) 15 MG tablet, TAKE 1 TABLET EVERY DAY, Disp: 90 tablet, Rfl: 3 .   Phenylephrine-Acetaminophen 5-325 MG CAPS, , Disp: , Rfl:  .  Vitamins A & D 5000-400 units CAPS, , Disp: , Rfl:   Not on File   Review of Systems  Constitutional: Negative for fever, chills, weight loss and malaise/fatigue.  HENT: Negative for hearing loss.   Eyes: Negative for blurred vision and double vision.  Respiratory: Negative for cough, shortness of breath and wheezing.   Cardiovascular: Negative for chest pain, palpitations and leg swelling.  Gastrointestinal: Negative for heartburn, abdominal pain and blood in stool.  Genitourinary: Positive for frequency. Negative for dysuria and urgency.  Musculoskeletal: Negative for myalgias and joint pain.  Skin: Negative for rash.  Neurological: Negative for dizziness, tremors, weakness and headaches.      Objective  Filed Vitals:   11/28/15 0909  BP: 136/75  Pulse: 61  Resp: 16  Height: 5\' 9"  (1.753 m)  Weight: 173 lb 9.6 oz (78.744 kg)    Physical Exam  Constitutional: He is oriented to person, place, and time and well-developed, well-nourished, and in no distress. No distress.  HENT:  Head: Normocephalic and atraumatic.  Right Ear: External ear normal.  Left Ear: External ear normal.  Nose: Nose normal.  Mouth/Throat: Oropharynx is clear and moist.  Eyes: Conjunctivae and EOM are normal. Pupils are equal, round, and reactive to light. No scleral icterus.  Fundoscopic exam:      The right eye shows no arteriolar narrowing, no AV nicking, no exudate, no hemorrhage and no papilledema.       The left eye shows no arteriolar narrowing, no AV nicking, no exudate, no hemorrhage and no papilledema.  Neck: Normal range of motion. Neck supple. Carotid bruit is not present. No thyromegaly present.  Cardiovascular: Normal rate and normal heart sounds.  Frequent extrasystoles are present. Exam reveals no gallop and no friction rub.   No murmur heard. Pulmonary/Chest: Effort normal and breath sounds normal. No respiratory  distress. He has no wheezes. He has no rales.  Abdominal: Soft. Bowel sounds are normal. He exhibits no distension and no mass. There is no tenderness.  Genitourinary: Rectum normal, testes/scrotum normal and penis normal. Prostate is enlarged (symmectrical, no nodules). Prostate is not tender.  Musculoskeletal: He exhibits no edema.  Lymphadenopathy:    He has no cervical adenopathy.  Neurological: He is alert and oriented to person, place, and time. No cranial nerve deficit.  Skin: Skin is warm and dry.  Vitals reviewed.      Recent Results (from the past 2160 hour(s))  Lipid Profile     Status: Abnormal   Collection Time: 10/26/15  8:24 AM  Result Value Ref Range   Cholesterol, Total 224 (H) 100 - 199 mg/dL   Triglycerides 68 0 - 149 mg/dL   HDL 83 >78 mg/dL   VLDL Cholesterol Cal 14 5 - 40 mg/dL   LDL Calculated 295 (H) 0 - 99 mg/dL   Chol/HDL Ratio 2.7 0.0 - 5.0 ratio units    Comment:                                   T. Chol/HDL Ratio                                             Men  Women                               1/2 Avg.Risk  3.4    3.3                                   Avg.Risk  5.0    4.4                                2X Avg.Risk  9.6    7.1                                3X Avg.Risk 23.4   11.0   CBC with Differential     Status: None   Collection Time: 10/26/15  8:24 AM  Result Value Ref Range   WBC 6.6 3.4 - 10.8 x10E3/uL   RBC 4.94 4.14 - 5.80 x10E6/uL   Hemoglobin 15.1 12.6 - 17.7 g/dL   Hematocrit 62.1 30.8 - 51.0 %   MCV 87 79 - 97 fL  MCH 30.6 26.6 - 33.0 pg   MCHC 35.1 31.5 - 35.7 g/dL   RDW 45.4 09.8 - 11.9 %   Platelets 261 150 - 379 x10E3/uL   Neutrophils 66 %   Lymphs 23 %   Monocytes 6 %   Eos 5 %   Basos 0 %   Neutrophils Absolute 4.4 1.4 - 7.0 x10E3/uL   Lymphocytes Absolute 1.5 0.7 - 3.1 x10E3/uL   Monocytes Absolute 0.4 0.1 - 0.9 x10E3/uL   EOS (ABSOLUTE) 0.3 0.0 - 0.4 x10E3/uL   Basophils Absolute 0.0 0.0 - 0.2 x10E3/uL    Immature Granulocytes 0 %   Immature Grans (Abs) 0.0 0.0 - 0.1 x10E3/uL  Comprehensive Metabolic Panel (CMET)     Status: None   Collection Time: 10/26/15  8:24 AM  Result Value Ref Range   Glucose 85 65 - 99 mg/dL   BUN 24 8 - 27 mg/dL   Creatinine, Ser 1.47 0.76 - 1.27 mg/dL   GFR calc non Af Amer 66 >59 mL/min/1.73   GFR calc Af Amer 76 >59 mL/min/1.73   BUN/Creatinine Ratio 21 10 - 22   Sodium 140 134 - 144 mmol/L   Potassium 4.7 3.5 - 5.2 mmol/L   Chloride 100 96 - 106 mmol/L   CO2 21 18 - 29 mmol/L   Calcium 9.8 8.6 - 10.2 mg/dL   Total Protein 6.9 6.0 - 8.5 g/dL   Albumin 4.4 3.6 - 4.8 g/dL   Globulin, Total 2.5 1.5 - 4.5 g/dL   Albumin/Globulin Ratio 1.8 1.1 - 2.5   Bilirubin Total 0.5 0.0 - 1.2 mg/dL   Alkaline Phosphatase 94 39 - 117 IU/L   AST 18 0 - 40 IU/L   ALT 20 0 - 44 IU/L  PSA     Status: None   Collection Time: 10/26/15  8:24 AM  Result Value Ref Range   Prostate Specific Ag, Serum 2.5 0.0 - 4.0 ng/mL    Comment: Roche ECLIA methodology. According to the American Urological Association, Serum PSA should decrease and remain at undetectable levels after radical prostatectomy. The AUA defines biochemical recurrence as an initial PSA value 0.2 ng/mL or greater followed by a subsequent confirmatory PSA value 0.2 ng/mL or greater. Values obtained with different assay methods or kits cannot be used interchangeably. Results cannot be interpreted as absolute evidence of the presence or absence of malignant disease.      Assessment & Plan  Problem List Items Addressed This Visit      Cardiovascular and Mediastinum   Essential (primary) hypertension   Relevant Medications   lovastatin (MEVACOR) 20 MG tablet     Musculoskeletal and Integument   Arthritis     Genitourinary   Benign fibroma of prostate   Relevant Medications   finasteride (PROSCAR) 5 MG tablet     Other   Enlarged prostate   Hypercholesteremia   Relevant Medications   lovastatin  (MEVACOR) 20 MG tablet   Chronic pain   Health maintenance examination - Primary      Meds ordered this encounter  Medications  . finasteride (PROSCAR) 5 MG tablet    Sig:   . lovastatin (MEVACOR) 20 MG tablet    Sig: Take one tablet every other night.    Dispense:  90 tablet    Refill:  3   1. Health maintenance examination   2. Essential (primary) hypertension Cont. meds  3. Arthritis Cont meds  4. Benign fibroma of prostate   5. Enlarged prostate Cont  meds  6. Hypercholesteremia Decrease Mevacor to every other night  7. Chronic pain

## 2016-01-05 ENCOUNTER — Telehealth: Payer: Self-pay | Admitting: Family Medicine

## 2016-01-05 NOTE — Telephone Encounter (Signed)
[facility] and requires a referral for his ortho visits for his right knee. He has an appt on April 7th with Vinetta BergamoMarcus Jones, PA at [facility]. If you would please send a referral in ASAP, he would be very appreciative. Chartie/Wendy

## 2016-01-05 NOTE — Telephone Encounter (Signed)
Referral will be entered.[facility] 

## 2016-01-12 ENCOUNTER — Telehealth: Payer: Self-pay | Admitting: Family Medicine

## 2016-01-12 NOTE — Telephone Encounter (Signed)
Amber from  [facility]  [phone],  Called pt need Authorization     NPI [id number] PA:? DX: M17.0 ( last visit  Both  Knees) DOS: 01/20/16

## 2016-01-12 NOTE — Telephone Encounter (Signed)
Done.[place] 

## 2016-01-20 DIAGNOSIS — M170 Bilateral primary osteoarthritis of knee: Secondary | ICD-10-CM | POA: Diagnosis not present

## 2016-06-12 ENCOUNTER — Encounter: Payer: Self-pay | Admitting: Family Medicine

## 2016-06-12 ENCOUNTER — Ambulatory Visit (INDEPENDENT_AMBULATORY_CARE_PROVIDER_SITE_OTHER): Payer: Commercial Managed Care - HMO | Admitting: Family Medicine

## 2016-06-12 VITALS — BP 145/70 | HR 68 | Temp 98.4°F | Resp 16 | Ht 69.0 in | Wt 164.6 lb

## 2016-06-12 DIAGNOSIS — N400 Enlarged prostate without lower urinary tract symptoms: Secondary | ICD-10-CM

## 2016-06-12 DIAGNOSIS — M1990 Unspecified osteoarthritis, unspecified site: Secondary | ICD-10-CM

## 2016-06-12 DIAGNOSIS — G8929 Other chronic pain: Secondary | ICD-10-CM | POA: Diagnosis not present

## 2016-06-12 DIAGNOSIS — I10 Essential (primary) hypertension: Secondary | ICD-10-CM | POA: Diagnosis not present

## 2016-06-12 DIAGNOSIS — E7800 Pure hypercholesterolemia, unspecified: Secondary | ICD-10-CM

## 2016-06-12 MED ORDER — TRAMADOL HCL 50 MG PO TABS: 50.0000 mg | ORAL_TABLET | Freq: Three times a day (TID) | ORAL | 3 refills | Status: DC | PRN

## 2016-06-12 MED ORDER — LISINOPRIL 30 MG PO TABS: 30.0000 mg | ORAL_TABLET | Freq: Every day | ORAL | 3 refills | Status: DC

## 2016-06-12 NOTE — Progress Notes (Signed)
Name: Jerry Simmons   MRN: [id number]    DOB: 1946-01-12   Date:06/12/2016       Progress Note  Subjective  Chief Complaint  Chief Complaint  Patient presents with  . Hypertension    6 month follow up  . Hyperlipidemia    6 month follow up    HPI Her for f/u of HBP.  He has elevated lipids, but good HDL.  Arthritis of knees and feet are bothering him more. Mobic + Tylenol not help much. He has started Flomax again because of increased urinary freq and nocturia.  No problem-specific Assessment & Plan notes found for this encounter.   Past Medical History:  Diagnosis Date  . Arthritis   . Hyperlipidemia   . Hypertension     History reviewed. No pertinent surgical history.  History reviewed. No pertinent family history.  Social History   Social History  . Marital status: Married    Spouse name: N/A  . Number of children: N/A  . Years of education: N/A   Occupational History  . Not on file.   Social History Main Topics  . Smoking status: Former Smoker    Packs/day: 3.00    Years: 20.00    Types: Cigarettes    Quit date: 09/16/1985  . Smokeless tobacco: Former NeurosurgeonUser  . Alcohol use 0.6 oz/week    1 Glasses of wine per week     Comment: Only has wine on occassional.   . Drug use: No  . Sexual activity: Not on file   Other Topics Concern  . Not on file   Social History Narrative  . No narrative on file     Current Outpatient Prescriptions:  .  amLODipine (NORVASC) 5 MG tablet, TAKE 1 TABLET EVERY DAY, Disp: 90 tablet, Rfl: 3 .  finasteride (PROSCAR) 5 MG tablet, , Disp: , Rfl:  .  lisinopril (PRINIVIL,ZESTRIL) 30 MG tablet, Take 1 tablet (30 mg total) by mouth daily., Disp: 90 tablet, Rfl: 3 .  lovastatin (MEVACOR) 20 MG tablet, Take one tablet every other night., Disp: 90 tablet, Rfl: 3 .  meloxicam (MOBIC) 15 MG tablet, TAKE 1 TABLET EVERY DAY, Disp: 90 tablet, Rfl: 3 .  tamsulosin (FLOMAX) 0.4 MG CAPS capsule, Take 0.4 mg by mouth daily after supper.,  Disp: , Rfl:  .  Vitamins A & D 5000-400 units CAPS, , Disp: , Rfl:  .  traMADol (ULTRAM) 50 MG tablet, Take 1 tablet (50 mg total) by mouth every 8 (eight) hours as needed for moderate pain., Disp: 90 tablet, Rfl: 3  Not on File   Review of Systems  Constitutional: Negative for chills, fever, malaise/fatigue and weight loss.  HENT: Negative for hearing loss.   Eyes: Negative for blurred vision and double vision.  Respiratory: Negative for cough, shortness of breath and wheezing.   Cardiovascular: Negative for chest pain, palpitations and leg swelling.  Gastrointestinal: Negative for abdominal pain, blood in stool and heartburn.  Genitourinary: Positive for frequency and urgency. Negative for dysuria.  Musculoskeletal: Positive for joint pain (Knees and feet). Negative for myalgias.  Skin: Negative for rash.  Neurological: Negative for dizziness, tremors, weakness and headaches.      Objective  Vitals:   06/12/16 0805 06/12/16 0834  BP: (!) 145/75 (!) 145/70  Pulse: 68   Resp: 16   Temp: 98.4 F (36.9 C)   TempSrc: Oral   Weight: 164 lb 9.6 oz (74.7 kg)   Height: 5\' 9"  (1.753 m)  Physical Exam  Constitutional: He is oriented to person, place, and time and well-developed, well-nourished, and in no distress. No distress.  HENT:  Head: Normocephalic and atraumatic.  Eyes: Conjunctivae and EOM are normal. Pupils are equal, round, and reactive to light. No scleral icterus.  Neck: Normal range of motion. Neck supple. Carotid bruit is not present. No thyromegaly present.  Cardiovascular: Normal rate.  Frequent extrasystoles are present. Exam reveals no gallop and no friction rub.   No murmur heard. Pulmonary/Chest: Effort normal and breath sounds normal. No respiratory distress. He has no wheezes. He has no rales.  Abdominal: Soft. Bowel sounds are normal. He exhibits no distension, no abdominal bruit and no mass. There is no tenderness.  Musculoskeletal: He exhibits no  edema.  Pain with ROM of both knees.  Lymphadenopathy:    He has no cervical adenopathy.  Neurological: He is alert and oriented to person, place, and time.  Vitals reviewed.      No results found for this or any previous visit (from the past 2160 hour(s)).   Assessment & Plan  Problem List Items Addressed This Visit      Cardiovascular and Mediastinum   Essential (primary) hypertension - Primary   Relevant Medications   lisinopril (PRINIVIL,ZESTRIL) 30 MG tablet     Musculoskeletal and Integument   Arthritis   Relevant Medications   traMADol (ULTRAM) 50 MG tablet     Genitourinary   Benign fibroma of prostate   Relevant Medications   tamsulosin (FLOMAX) 0.4 MG CAPS capsule     Other   Hypercholesteremia   Relevant Medications   lisinopril (PRINIVIL,ZESTRIL) 30 MG tablet   Chronic pain   Relevant Medications   traMADol (ULTRAM) 50 MG tablet    Other Visit Diagnoses   None.     Meds ordered this encounter  Medications  . tamsulosin (FLOMAX) 0.4 MG CAPS capsule    Sig: Take 0.4 mg by mouth daily after supper.  Marland Kitchen lisinopril (PRINIVIL,ZESTRIL) 30 MG tablet    Sig: Take 1 tablet (30 mg total) by mouth daily.    Dispense:  90 tablet    Refill:  3  . traMADol (ULTRAM) 50 MG tablet    Sig: Take 1 tablet (50 mg total) by mouth every 8 (eight) hours as needed for moderate pain.    Dispense:  90 tablet    Refill:  3   1. Essential (primary) hypertension Cont Am lodipine - lisinopril (PRINIVIL,ZESTRIL) 30 MG tablet; Take 1 tablet (30 mg total) by mouth daily.  Dispense: 90 tablet; Refill: 3  2. Arthritis D/C Mobic - traMADol (ULTRAM) 50 MG tablet; Take 1 tablet (50 mg total) by mouth every 8 (eight) hours as needed for moderate pain.  Dispense: 90 tablet; Refill: 3  3. Benign fibroma of prostate  Cont Proscar and Flomax 4. Hypercholesteremia Cont. Lovastatin  5. Chronic pain

## 2016-09-12 ENCOUNTER — Other Ambulatory Visit: Payer: Self-pay | Admitting: Family Medicine

## 2016-09-12 ENCOUNTER — Encounter: Payer: Self-pay | Admitting: *Deleted

## 2016-09-12 DIAGNOSIS — M1990 Unspecified osteoarthritis, unspecified site: Secondary | ICD-10-CM

## 2016-09-12 NOTE — Telephone Encounter (Signed)
I am covering inbox for patient's PCP Dr Juanetta GoslingHawkins.  Attempted to call patient regarding refill request received today on Tramadol 50mg . Chart review shows patient was newly started on Tramadol 06/12/16 for arthritis after office visit. I do not see that he had previously been on Tramadol or any other opiate controlled substance for pain in the past. I checked [place] CSRS over past 1 year and this is the only controlled rx listed, no concerns or red flags. Patient was given 90 tablet supply, and he has refilled it 3 times total, for 90 pills per month. He was previously on meloxicam but this was discontinued on 06/12/16.  After I discuss Tramadol treatment with patient, and if indicated then I could provide temporary refill for 1 month supply to be printed and picked up by patient, he will need to discuss further refills with PCP Dr Juanetta GoslingHawkins upon his return.  Saralyn PilarAlexander Karamalegos, DO [facility] 09/12/2016, 1:14 PM

## 2016-09-17 ENCOUNTER — Encounter: Payer: Self-pay | Admitting: Family Medicine

## 2016-09-17 ENCOUNTER — Ambulatory Visit (INDEPENDENT_AMBULATORY_CARE_PROVIDER_SITE_OTHER): Payer: Commercial Managed Care - HMO | Admitting: Family Medicine

## 2016-09-17 VITALS — BP 145/70 | HR 84 | Temp 97.5°F | Resp 16 | Ht 70.0 in | Wt 166.0 lb

## 2016-09-17 DIAGNOSIS — M1990 Unspecified osteoarthritis, unspecified site: Secondary | ICD-10-CM | POA: Diagnosis not present

## 2016-09-17 DIAGNOSIS — E7800 Pure hypercholesterolemia, unspecified: Secondary | ICD-10-CM

## 2016-09-17 DIAGNOSIS — I10 Essential (primary) hypertension: Secondary | ICD-10-CM

## 2016-09-17 MED ORDER — LISINOPRIL 40 MG PO TABS: 40.0000 mg | ORAL_TABLET | Freq: Every day | ORAL | 3 refills | Status: DC

## 2016-09-17 MED ORDER — TRAMADOL HCL 50 MG PO TABS: 50.0000 mg | ORAL_TABLET | Freq: Four times a day (QID) | ORAL | 5 refills | Status: DC | PRN

## 2016-09-17 NOTE — Progress Notes (Signed)
Name: Jerry Simmons   MRN: [id number]    DOB: Jun 29, 1946   Date:09/17/2016       Progress Note  Subjective  Chief Complaint  Chief Complaint  Patient presents with  . Hypertension  . Arthritis    HPI Here for f/u of HBP and arthritis.  He is taking Tramadol and Mobic together for his arthritis.  He says that his knee is better with Tramadol and Mobic helps his feet better. He is taking all BP meds  No problem-specific Assessment & Plan notes found for this encounter.   Past Medical History:  Diagnosis Date  . Arthritis   . Hyperlipidemia   . Hypertension     History reviewed. No pertinent surgical history.  History reviewed. No pertinent family history.  Social History   Social History  . Marital status: Married    Spouse name: N/A  . Number of children: N/A  . Years of education: N/A   Occupational History  . Not on file.   Social History Main Topics  . Smoking status: Former Smoker    Packs/day: 3.00    Years: 20.00    Types: Cigarettes    Quit date: 09/16/1985  . Smokeless tobacco: Former NeurosurgeonUser  . Alcohol use 0.6 oz/week    1 Glasses of wine per week     Comment: Only has wine on occassional.   . Drug use: No  . Sexual activity: Not on file   Other Topics Concern  . Not on file   Social History Narrative  . No narrative on file     Current Outpatient Prescriptions:  .  amLODipine (NORVASC) 5 MG tablet, TAKE 1 TABLET EVERY DAY, Disp: 90 tablet, Rfl: 3 .  finasteride (PROSCAR) 5 MG tablet, , Disp: , Rfl:  .  lisinopril (PRINIVIL,ZESTRIL) 40 MG tablet, Take 1 tablet (40 mg total) by mouth daily., Disp: 90 tablet, Rfl: 3 .  lovastatin (MEVACOR) 20 MG tablet, Take one tablet every other night., Disp: 90 tablet, Rfl: 3 .  tamsulosin (FLOMAX) 0.4 MG CAPS capsule, Take 0.4 mg by mouth daily after supper., Disp: , Rfl:  .  traMADol (ULTRAM) 50 MG tablet, Take 1 tablet (50 mg total) by mouth every 6 (six) hours as needed for moderate pain., Disp: 120 tablet,  Rfl: 5 .  Vitamins A & D 5000-400 units CAPS, , Disp: , Rfl:  .  meloxicam (MOBIC) 15 MG tablet, Take 15 mg by mouth daily., Disp: , Rfl:  .  Multiple Vitamins-Minerals (DAILY MULTIVITAMIN PO), Take 1 tablet by mouth daily., Disp: , Rfl:   Not on File   Review of Systems  Constitutional: Negative for chills, fever, malaise/fatigue and weight loss.  HENT: Negative for hearing loss and tinnitus.   Eyes: Negative for blurred vision and double vision.  Respiratory: Negative for cough, shortness of breath and wheezing.   Cardiovascular: Negative for chest pain, palpitations and leg swelling.  Gastrointestinal: Negative for abdominal pain, blood in stool and heartburn.  Genitourinary: Negative for dysuria, frequency and urgency.  Musculoskeletal: Positive for joint pain. Negative for myalgias.  Skin: Negative for rash.  Neurological: Negative for dizziness, tingling, tremors, weakness and headaches.      Objective  Vitals:   09/17/16 1321 09/17/16 1356  BP: 137/79 (!) 145/70  Pulse: 84   Resp: 16   Temp: 97.5 F (36.4 C)   TempSrc: Oral   Weight: 166 lb (75.3 kg)   Height: 5\' 10"  (1.778 m)     Physical  Exam  Constitutional: He is oriented to person, place, and time and well-developed, well-nourished, and in no distress. No distress.  HENT:  Head: Normocephalic and atraumatic.  Eyes: Conjunctivae and EOM are normal. Pupils are equal, round, and reactive to light.  Neck: Normal range of motion. Neck supple. No thyromegaly present.  Cardiovascular: Normal rate, regular rhythm and normal heart sounds.  Exam reveals no gallop and no friction rub.   No murmur heard. Pulmonary/Chest: Effort normal and breath sounds normal. No respiratory distress. He exhibits no tenderness.  Abdominal: Soft. Bowel sounds are normal. He exhibits no distension and no mass. There is no tenderness.  Musculoskeletal: He exhibits no edema.  Lymphadenopathy:    He has no cervical adenopathy.   Neurological: He is alert and oriented to person, place, and time.  Vitals reviewed.      No results found for this or any previous visit (from the past 2160 hour(s)).   Assessment & Plan  Problem List Items Addressed This Visit      Cardiovascular and Mediastinum   Essential (primary) hypertension   Relevant Medications   lisinopril (PRINIVIL,ZESTRIL) 40 MG tablet   Other Relevant Orders   COMPLETE METABOLIC PANEL WITH GFR     Musculoskeletal and Integument   Arthritis - Primary   Relevant Medications   meloxicam (MOBIC) 15 MG tablet   traMADol (ULTRAM) 50 MG tablet   Other Relevant Orders   CBC with Differential     Other   Hypercholesteremia   Relevant Medications   lisinopril (PRINIVIL,ZESTRIL) 40 MG tablet   Other Relevant Orders   Lipid Profile      Meds ordered this encounter  Medications  . meloxicam (MOBIC) 15 MG tablet    Sig: Take 15 mg by mouth daily.  . Multiple Vitamins-Minerals (DAILY MULTIVITAMIN PO)    Sig: Take 1 tablet by mouth daily.  Marland Kitchen. lisinopril (PRINIVIL,ZESTRIL) 40 MG tablet    Sig: Take 1 tablet (40 mg total) by mouth daily.    Dispense:  90 tablet    Refill:  3  . traMADol (ULTRAM) 50 MG tablet    Sig: Take 1 tablet (50 mg total) by mouth every 6 (six) hours as needed for moderate pain.    Dispense:  120 tablet    Refill:  5   1. Essential (primary) hypertension  - COMPLETE METABOLIC PANEL WITH GFR - lisinopril (PRINIVIL,ZESTRIL) 40 MG tablet; Take 1 tablet (40 mg total) by mouth daily.  Dispense: 90 tablet; Refill: 3 cont Amlodipine,   2. Arthritis  - CBC with Differential - traMADol (ULTRAM) 50 MG tablet; Take 1 tablet (50 mg total) by mouth every 6 (six) hours as needed for moderate pain.  Dispense: 120 tablet; Refill: 5  3. Hypercholesteremia Cont Lovastatin - Lipid Profile

## 2016-09-19 ENCOUNTER — Other Ambulatory Visit: Payer: Commercial Managed Care - HMO

## 2016-09-19 LAB — CBC WITH DIFFERENTIAL/PLATELET
BASOS PCT: 1 %
Basophils Absolute: 56 cells/uL (ref 0–200)
EOS PCT: 7 %
Eosinophils Absolute: 392 cells/uL (ref 15–500)
HCT: 41.5 % (ref 38.5–50.0)
HEMOGLOBIN: 13.7 g/dL (ref 13.2–17.1)
LYMPHS ABS: 1344 {cells}/uL (ref 850–3900)
LYMPHS PCT: 24 %
MCH: 29.8 pg (ref 27.0–33.0)
MCHC: 33 g/dL (ref 32.0–36.0)
MCV: 90.4 fL (ref 80.0–100.0)
MPV: 9.7 fL (ref 7.5–12.5)
Monocytes Absolute: 448 cells/uL (ref 200–950)
Monocytes Relative: 8 %
NEUTROS PCT: 60 %
Neutro Abs: 3360 cells/uL (ref 1500–7800)
Platelets: 257 10*3/uL (ref 140–400)
RBC: 4.59 MIL/uL (ref 4.20–5.80)
RDW: 14.1 % (ref 11.0–15.0)
WBC: 5.6 10*3/uL (ref 3.8–10.8)

## 2016-09-19 LAB — COMPLETE METABOLIC PANEL WITH GFR
ALBUMIN: 3.9 g/dL (ref 3.6–5.1)
ALK PHOS: 75 U/L (ref 40–115)
ALT: 13 U/L (ref 9–46)
AST: 16 U/L (ref 10–35)
BUN: 26 mg/dL — AB (ref 7–25)
CALCIUM: 8.9 mg/dL (ref 8.6–10.3)
CO2: 23 mmol/L (ref 20–31)
Chloride: 105 mmol/L (ref 98–110)
Creat: 1.21 mg/dL (ref 0.70–1.25)
GFR, EST AFRICAN AMERICAN: 70 mL/min (ref >=60)
GFR, Est Non African American: 61 mL/min (ref >=60)
Glucose, Bld: 91 mg/dL (ref 65–99)
POTASSIUM: 4.5 mmol/L (ref 3.5–5.3)
Sodium: 139 mmol/L (ref 135–146)
Total Bilirubin: 0.5 mg/dL (ref 0.2–1.2)
Total Protein: 6.3 g/dL (ref 6.1–8.1)

## 2016-09-19 LAB — LIPID PANEL
CHOLESTEROL: 202 mg/dL — AB (ref <200)
HDL: 85 mg/dL (ref >40)
LDL Cholesterol: 108 mg/dL — ABNORMAL HIGH (ref <100)
TRIGLYCERIDES: 44 mg/dL (ref <150)
Total CHOL/HDL Ratio: 2.4 Ratio (ref <5.0)
VLDL: 9 mg/dL (ref <30)

## 2016-10-02 ENCOUNTER — Other Ambulatory Visit: Payer: Self-pay | Admitting: Family Medicine

## 2016-10-30 ENCOUNTER — Other Ambulatory Visit: Payer: Self-pay | Admitting: Family Medicine

## 2016-12-03 ENCOUNTER — Encounter: Payer: Self-pay | Admitting: Family Medicine

## 2016-12-03 ENCOUNTER — Ambulatory Visit (INDEPENDENT_AMBULATORY_CARE_PROVIDER_SITE_OTHER): Payer: Commercial Managed Care - HMO | Admitting: Family Medicine

## 2016-12-03 VITALS — BP 140/70 | HR 61 | Temp 97.7°F | Resp 16 | Ht 70.0 in | Wt 169.0 lb

## 2016-12-03 DIAGNOSIS — G8929 Other chronic pain: Secondary | ICD-10-CM

## 2016-12-03 DIAGNOSIS — N400 Enlarged prostate without lower urinary tract symptoms: Secondary | ICD-10-CM | POA: Diagnosis not present

## 2016-12-03 DIAGNOSIS — E7800 Pure hypercholesterolemia, unspecified: Secondary | ICD-10-CM | POA: Diagnosis not present

## 2016-12-03 DIAGNOSIS — M1990 Unspecified osteoarthritis, unspecified site: Secondary | ICD-10-CM | POA: Diagnosis not present

## 2016-12-03 DIAGNOSIS — I10 Essential (primary) hypertension: Secondary | ICD-10-CM

## 2016-12-03 MED ORDER — FINASTERIDE 5 MG PO TABS: 5.0000 mg | ORAL_TABLET | Freq: Every day | ORAL | 3 refills | Status: DC

## 2016-12-03 MED ORDER — TAMSULOSIN HCL 0.4 MG PO CAPS: 0.4000 mg | ORAL_CAPSULE | Freq: Every day | ORAL | 3 refills | Status: DC

## 2016-12-03 MED ORDER — AMLODIPINE BESYLATE 5 MG PO TABS: 5.0000 mg | ORAL_TABLET | Freq: Every day | ORAL | 3 refills | Status: DC

## 2016-12-03 MED ORDER — MELOXICAM 15 MG PO TABS: 15.0000 mg | ORAL_TABLET | ORAL | 3 refills | Status: DC

## 2016-12-03 MED ORDER — LOVASTATIN 20 MG PO TABS: ORAL_TABLET | ORAL | 3 refills | Status: DC

## 2016-12-03 MED ORDER — TRAMADOL HCL 50 MG PO TABS: 50.0000 mg | ORAL_TABLET | Freq: Four times a day (QID) | ORAL | 5 refills | Status: DC | PRN

## 2016-12-03 MED ORDER — LISINOPRIL 40 MG PO TABS: 40.0000 mg | ORAL_TABLET | Freq: Every day | ORAL | 3 refills | Status: DC

## 2016-12-03 NOTE — Progress Notes (Signed)
Name: Jerry Simmons   MRN: [id number]    DOB: [birth year]   Date:12/03/2016       Progress Note  Subjective  Chief Complaint  Chief Complaint  Patient presents with  . Arthritis  . Hypertension    HPI Here for f/u of HBP and arthritis.  HE is taking BP meds and Mobic and Tramadol for his arthritis (feet, knees).  Only taking Mobic about 3 days a week. No problem-specific Assessment & Plan notes found for this encounter.   Past Medical History:  Diagnosis Date  . Arthritis   . Hyperlipidemia   . Hypertension     History reviewed. No pertinent surgical history.  History reviewed. No pertinent family history.  Social History   Social History  . Marital status: Married    Spouse name: N/A  . Number of children: N/A  . Years of education: N/A   Occupational History  . Not on file.   Social History Main Topics  . Smoking status: Former Smoker    Packs/day: 3.00    Years: 20.00    Types: Cigarettes    Quit date: 09/16/1985  . Smokeless tobacco: Former Systems developer  . Alcohol use 0.6 oz/week    1 Glasses of wine per week     Comment: Only has wine on occassional.   . Drug use: No  . Sexual activity: Not on file   Other Topics Concern  . Not on file   Social History Narrative  . No narrative on file     Current Outpatient Prescriptions:  .  amLODipine (NORVASC) 5 MG tablet, Take 1 tablet (5 mg total) by mouth daily., Disp: 90 tablet, Rfl: 3 .  finasteride (PROSCAR) 5 MG tablet, Take 1 tablet (5 mg total) by mouth daily., Disp: 90 tablet, Rfl: 3 .  lisinopril (PRINIVIL,ZESTRIL) 40 MG tablet, Take 1 tablet (40 mg total) by mouth daily., Disp: 90 tablet, Rfl: 3 .  lovastatin (MEVACOR) 20 MG tablet, Take one tablet every other night., Disp: 45 tablet, Rfl: 3 .  meloxicam (MOBIC) 15 MG tablet, Take 1 tablet (15 mg total) by mouth every other day., Disp: 30 tablet, Rfl: 3 .  Multiple Vitamins-Minerals (DAILY MULTIVITAMIN PO), Take 1 tablet by mouth daily., Disp: , Rfl:  .   tamsulosin (FLOMAX) 0.4 MG CAPS capsule, Take 1 capsule (0.4 mg total) by mouth at bedtime., Disp: 90 capsule, Rfl: 3 .  traMADol (ULTRAM) 50 MG tablet, Take 1 tablet (50 mg total) by mouth every 6 (six) hours as needed for moderate pain., Disp: 120 tablet, Rfl: 5 .  Vitamins A & D 5000-400 units CAPS, , Disp: , Rfl:   Not on File   Review of Systems  Constitutional: Negative for chills, fever, malaise/fatigue and weight loss.  HENT: Negative for hearing loss and tinnitus.   Eyes: Negative for blurred vision and double vision.  Respiratory: Negative for cough, shortness of breath and wheezing.   Cardiovascular: Negative for chest pain, palpitations and leg swelling.  Gastrointestinal: Negative for abdominal pain, blood in stool, heartburn and nausea.  Genitourinary: Negative for dysuria, frequency and urgency.  Musculoskeletal: Positive for joint pain (knees and feet.). Negative for myalgias.  Skin: Negative for rash.  Neurological: Negative for dizziness, tingling, tremors, weakness and headaches.      Objective  Vitals:   12/03/16 0819 12/03/16 0847  BP: 128/64 140/70  Pulse: 61   Resp: 16   Temp: 97.7 F (36.5 C)   TempSrc: Oral   Weight: 169  lb (76.7 kg)   Height: _0  (1.778 m)     Physical Exam  Constitutional: He is oriented to person, place, and time and well-developed, well-nourished, and in no distress. No distress.  HENT:  Head: Normocephalic and atraumatic.  Eyes: Conjunctivae and EOM are normal. Pupils are equal, round, and reactive to light. No scleral icterus.  Neck: Normal range of motion. Neck supple. No thyromegaly present.  Cardiovascular: Normal rate, regular rhythm and normal heart sounds.   Occasional extrasystoles are present. Exam reveals no gallop and no friction rub.   No murmur heard. Pulmonary/Chest: No respiratory distress. He has no wheezes. He has no rales.  Abdominal: Soft. Bowel sounds are normal. He exhibits no distension and no mass.  There is no tenderness.  Musculoskeletal: He exhibits no edema.  Lymphadenopathy:    He has no cervical adenopathy.  Neurological: He is alert and oriented to person, place, and time.  Vitals reviewed.     Recent Results (from the past 2160 hour(s))  COMPLETE METABOLIC PANEL WITH GFR     Status: Abnormal   Collection Time: 09/17/16 12:01 AM  Result Value Ref Range   Sodium 139 135 - 146 mmol/L   Potassium 4.5 3.5 - 5.3 mmol/L   Chloride 105 98 - 110 mmol/L   CO2 23 20 - 31 mmol/L   Glucose, Bld 91 65 - 99 mg/dL   BUN 26 (H) 7 - 25 mg/dL   Creat 1.21 0.70 - 1.25 mg/dL    Comment:   For patients > or = 70 years of age: The upper reference limit for Creatinine is approximately 13% higher for people identified as African-American.      Total Bilirubin 0.5 0.2 - 1.2 mg/dL   Alkaline Phosphatase 75 40 - 115 U/L   AST 16 10 - 35 U/L   ALT 13 9 - 46 U/L   Total Protein 6.3 6.1 - 8.1 g/dL   Albumin 3.9 3.6 - 5.1 g/dL   Calcium 8.9 8.6 - 10.3 mg/dL   GFR, Est African American 70 >=60 mL/min   GFR, Est Non African American 61 >=60 mL/min  CBC with Differential     Status: None   Collection Time: 09/17/16 12:01 AM  Result Value Ref Range   WBC 5.6 3.8 - 10.8 K/uL   RBC 4.59 4.20 - 5.80 MIL/uL   Hemoglobin 13.7 13.2 - 17.1 g/dL   HCT 41.5 38.5 - 50.0 %   MCV 90.4 80.0 - 100.0 fL   MCH 29.8 27.0 - 33.0 pg   MCHC 33.0 32.0 - 36.0 g/dL   RDW 14.1 11.0 - 15.0 %   Platelets 257 140 - 400 K/uL   MPV 9.7 7.5 - 12.5 fL   Neutro Abs 3,360 1,500 - 7,800 cells/uL   Lymphs Abs 1,344 850 - 3,900 cells/uL   Monocytes Absolute 448 200 - 950 cells/uL   Eosinophils Absolute 392 15 - 500 cells/uL   Basophils Absolute 56 0 - 200 cells/uL   Neutrophils Relative % 60 %   Lymphocytes Relative 24 %   Monocytes Relative 8 %   Eosinophils Relative 7 %   Basophils Relative 1 %   Smear Review Criteria for review not met   Lipid Profile     Status: Abnormal   Collection Time: 09/17/16 12:01 AM   Result Value Ref Range   Cholesterol 202 (H) <200 mg/dL   Triglycerides 44 <150 mg/dL   HDL 85 >40 mg/dL   Total CHOL/HDL Ratio  2.4 <5.0 Ratio   VLDL 9 <30 mg/dL   LDL Cholesterol 108 (H) <100 mg/dL     Assessment & Plan  Problem List Items Addressed This Visit      Cardiovascular and Mediastinum   Essential (primary) hypertension - Primary   Relevant Medications   amLODipine (NORVASC) 5 MG tablet   lisinopril (PRINIVIL,ZESTRIL) 40 MG tablet   lovastatin (MEVACOR) 20 MG tablet     Musculoskeletal and Integument   Arthritis   Relevant Medications   meloxicam (MOBIC) 15 MG tablet   traMADol (ULTRAM) 50 MG tablet     Other   Enlarged prostate   Relevant Medications   finasteride (PROSCAR) 5 MG tablet   tamsulosin (FLOMAX) 0.4 MG CAPS capsule   Hypercholesteremia   Relevant Medications   amLODipine (NORVASC) 5 MG tablet   lisinopril (PRINIVIL,ZESTRIL) 40 MG tablet   lovastatin (MEVACOR) 20 MG tablet   Chronic pain   Relevant Medications   meloxicam (MOBIC) 15 MG tablet   traMADol (ULTRAM) 50 MG tablet      Meds ordered this encounter  Medications  . amLODipine (NORVASC) 5 MG tablet    Sig: Take 1 tablet (5 mg total) by mouth daily.    Dispense:  90 tablet    Refill:  3  . lisinopril (PRINIVIL,ZESTRIL) 40 MG tablet    Sig: Take 1 tablet (40 mg total) by mouth daily.    Dispense:  90 tablet    Refill:  3  . finasteride (PROSCAR) 5 MG tablet    Sig: Take 1 tablet (5 mg total) by mouth daily.    Dispense:  90 tablet    Refill:  3  . tamsulosin (FLOMAX) 0.4 MG CAPS capsule    Sig: Take 1 capsule (0.4 mg total) by mouth at bedtime.    Dispense:  90 capsule    Refill:  3  . lovastatin (MEVACOR) 20 MG tablet    Sig: Take one tablet every other night.    Dispense:  45 tablet    Refill:  3  . meloxicam (MOBIC) 15 MG tablet    Sig: Take 1 tablet (15 mg total) by mouth every other day.    Dispense:  30 tablet    Refill:  3    Take on a rare basis only.  .  traMADol (ULTRAM) 50 MG tablet    Sig: Take 1 tablet (50 mg total) by mouth every 6 (six) hours as needed for moderate pain.    Dispense:  120 tablet    Refill:  5   1. Essential (primary) hypertension  - amLODipine (NORVASC) 5 MG tablet; Take 1 tablet (5 mg total) by mouth daily.  Dispense: 90 tablet; Refill: 3 - lisinopril (PRINIVIL,ZESTRIL) 40 MG tablet; Take 1 tablet (40 mg total) by mouth daily.  Dispense: 90 tablet; Refill: 3  2. Arthritis  - meloxicam (MOBIC) 15 MG tablet; Take 1 tablet (15 mg total) by mouth every other day.  Dispense: 30 tablet; Refill: 3 - traMADol (ULTRAM) 50 MG tablet; Take 1 tablet (50 mg total) by mouth every 6 (six) hours as needed for moderate pain.  Dispense: 120 tablet; Refill: 5  3. Other chronic pain   4. Hypercholesteremia  - lovastatin (MEVACOR) 20 MG tablet; Take one tablet every other night.  Dispense: 45 tablet; Refill: 3  5. Enlarged prostate  - finasteride (PROSCAR) 5 MG tablet; Take 1 tablet (5 mg total) by mouth daily.  Dispense: 90 tablet; Refill: 3 - tamsulosin (FLOMAX)  0.4 MG CAPS capsule; Take 1 capsule (0.4 mg total) by mouth at bedtime.  Dispense: 90 capsule; Refill: 3

## 2016-12-10 ENCOUNTER — Telehealth: Payer: Self-pay | Admitting: Family Medicine

## 2016-12-10 DIAGNOSIS — M25562 Pain in left knee: Secondary | ICD-10-CM

## 2016-12-10 NOTE — Telephone Encounter (Signed)
Pt needs a referral for left knee pain at [facility].  His call back number is [phone]

## 2016-12-17 DIAGNOSIS — M1712 Unilateral primary osteoarthritis, left knee: Secondary | ICD-10-CM | POA: Diagnosis not present

## 2016-12-17 DIAGNOSIS — M79672 Pain in left foot: Secondary | ICD-10-CM | POA: Diagnosis not present

## 2016-12-17 DIAGNOSIS — M1711 Unilateral primary osteoarthritis, right knee: Secondary | ICD-10-CM | POA: Diagnosis not present

## 2017-03-19 ENCOUNTER — Telehealth: Payer: Self-pay | Admitting: Family Medicine

## 2017-03-19 NOTE — Telephone Encounter (Signed)
Called pt to schedule Annual Wellness Visit with Nurse Health Advisor for:  - Jerry Simmons °

## 2017-04-25 ENCOUNTER — Other Ambulatory Visit: Payer: Self-pay | Admitting: Family Medicine

## 2017-04-25 DIAGNOSIS — M1990 Unspecified osteoarthritis, unspecified site: Secondary | ICD-10-CM

## 2017-04-25 MED ORDER — MELOXICAM 15 MG PO TABS: 15.0000 mg | ORAL_TABLET | ORAL | 2 refills | Status: DC

## 2017-04-25 NOTE — Telephone Encounter (Signed)
Refilled Meloxicam to [facility].  Saralyn PilarAlexander Glendale Youngblood, DO [facility] 04/25/2017, 3:42 PM

## 2017-04-30 ENCOUNTER — Ambulatory Visit: Payer: Commercial Managed Care - HMO

## 2017-05-02 ENCOUNTER — Ambulatory Visit: Payer: Commercial Managed Care - HMO | Admitting: Family Medicine

## 2017-05-03 ENCOUNTER — Telehealth: Payer: Self-pay

## 2017-05-03 NOTE — Telephone Encounter (Signed)
Called to reschedule cancelled AWV. Pt declined this visit at this time

## 2017-05-10 ENCOUNTER — Other Ambulatory Visit: Payer: Commercial Managed Care - HMO

## 2017-05-10 DIAGNOSIS — R799 Abnormal finding of blood chemistry, unspecified: Secondary | ICD-10-CM

## 2017-05-10 DIAGNOSIS — E7800 Pure hypercholesterolemia, unspecified: Secondary | ICD-10-CM | POA: Diagnosis not present

## 2017-05-10 DIAGNOSIS — I10 Essential (primary) hypertension: Secondary | ICD-10-CM

## 2017-05-11 LAB — COMPLETE METABOLIC PANEL WITH GFR
ALBUMIN: 4.1 g/dL (ref 3.6–5.1)
ALK PHOS: 86 U/L (ref 40–115)
ALT: 13 U/L (ref 9–46)
AST: 14 U/L (ref 10–35)
BILIRUBIN TOTAL: 0.6 mg/dL (ref 0.2–1.2)
BUN: 28 mg/dL — AB (ref 7–25)
CALCIUM: 9 mg/dL (ref 8.6–10.3)
CO2: 20 mmol/L (ref 20–31)
CREATININE: 1.37 mg/dL — AB (ref 0.70–1.18)
Chloride: 107 mmol/L (ref 98–110)
GFR, Est African American: 60 mL/min (ref >=60)
GFR, Est Non African American: 52 mL/min — ABNORMAL LOW (ref >=60)
GLUCOSE: 87 mg/dL (ref 65–99)
Potassium: 4.5 mmol/L (ref 3.5–5.3)
Sodium: 139 mmol/L (ref 135–146)
TOTAL PROTEIN: 6.5 g/dL (ref 6.1–8.1)

## 2017-05-11 LAB — LIPID PANEL
CHOL/HDL RATIO: 2.5 ratio (ref <5.0)
CHOLESTEROL: 212 mg/dL — AB (ref <200)
HDL: 84 mg/dL (ref >40)
LDL Cholesterol: 118 mg/dL — ABNORMAL HIGH (ref <100)
Triglycerides: 50 mg/dL (ref <150)
VLDL: 10 mg/dL (ref <30)

## 2017-05-11 LAB — HEMOGLOBIN A1C
Hgb A1c MFr Bld: 5.4 % (ref <5.7)
MEAN PLASMA GLUCOSE: 108 mg/dL

## 2017-05-17 ENCOUNTER — Encounter: Payer: Self-pay | Admitting: Family Medicine

## 2017-05-17 ENCOUNTER — Ambulatory Visit (INDEPENDENT_AMBULATORY_CARE_PROVIDER_SITE_OTHER): Payer: Medicare HMO | Admitting: Family Medicine

## 2017-05-17 VITALS — BP 138/60 | HR 64 | Temp 98.0°F | Resp 16 | Ht 70.0 in | Wt 164.0 lb

## 2017-05-17 DIAGNOSIS — N183 Chronic kidney disease, stage 3 unspecified: Secondary | ICD-10-CM

## 2017-05-17 DIAGNOSIS — I10 Essential (primary) hypertension: Secondary | ICD-10-CM

## 2017-05-17 DIAGNOSIS — M25562 Pain in left knee: Secondary | ICD-10-CM

## 2017-05-17 DIAGNOSIS — H6122 Impacted cerumen, left ear: Secondary | ICD-10-CM

## 2017-05-17 DIAGNOSIS — E7800 Pure hypercholesterolemia, unspecified: Secondary | ICD-10-CM | POA: Diagnosis not present

## 2017-05-17 DIAGNOSIS — M25561 Pain in right knee: Secondary | ICD-10-CM | POA: Diagnosis not present

## 2017-05-17 DIAGNOSIS — M79672 Pain in left foot: Secondary | ICD-10-CM

## 2017-05-17 DIAGNOSIS — M150 Primary generalized (osteo)arthritis: Secondary | ICD-10-CM | POA: Diagnosis not present

## 2017-05-17 DIAGNOSIS — M1990 Unspecified osteoarthritis, unspecified site: Secondary | ICD-10-CM

## 2017-05-17 DIAGNOSIS — M159 Polyosteoarthritis, unspecified: Secondary | ICD-10-CM

## 2017-05-17 DIAGNOSIS — G8929 Other chronic pain: Secondary | ICD-10-CM

## 2017-05-17 MED ORDER — TRAMADOL HCL 50 MG PO TABS: 50.0000 mg | ORAL_TABLET | Freq: Four times a day (QID) | ORAL | 5 refills | Status: DC | PRN

## 2017-05-17 MED ORDER — DICLOFENAC SODIUM 1 % TD GEL: 2.0000 g | Freq: Three times a day (TID) | TRANSDERMAL | 3 refills | Status: DC | PRN

## 2017-05-17 MED ORDER — ASPIRIN EC 81 MG PO TBEC: 81.0000 mg | DELAYED_RELEASE_TABLET | Freq: Every day | ORAL | Status: DC

## 2017-05-17 NOTE — Progress Notes (Signed)
Subjective:    Patient ID: Jerry Simmons, male    DOB: 1946-06-16, 71 y.o.   MRN: [id number]  Jerry AlbertWayne Milillo is a 71 y.o. male presenting on 05/17/2017 for Hypertension; Chronic Kidney Disease; and Osteoarthritis   HPI   CHRONIC HTN: Reports usually BP higher in doctors office, home readings normal range, has for review Current Meds - Amlodipine 5mg  daily, Lisinopril 40mg  daily   Reports good compliance, took meds today. Tolerating well, w/o complaints.  CKD-III - Chronic history of elevated Cr with dx CKD. Has discussed with prior PCP and advised to limit NSAIDs - Currently is now on Meloxicam 15mg  3 x weekly instead of regularly, takes for arthritis, se below - He tries to stay hydrated - Never seen Nephrology  Osteoarthritis, Multiple joints (knees, hands, feet) - Chronic problem with arthritis in several joints for years, without significant worsening, affecting his function at times, otherwise improve with rest and medicines. Has had prior X-rays confirming arthritis, previously followed by Dr Juanell FairlyKevin Krasinski ([facility]) for knees and other joints before, he is interested to return regarding his L foot. - Treatment with Meloxicam 15mg  every other week or 3 x week (he was stopped on Aleve previously), Tramadol 50mg  PRN pain q 6 hour about 4x daily - No longer on any Tylenol, was up to 6x daily without relief - Regarding knees, R knee > L knee worse with pain in past, tried steroid injections with good results, now worsening L foot pain - Prior history of staph infection R shoulder 2006, hospitalized - Uses Horse Absorbine Liniment Cream for joint pain (OTC) daily - He has done PT before for some joints, and had injections in knees. Interested in injection in foot, and will consider returning for knee injection if needed. Requesting referral back to Ortho  HYPERLIPIDEMIA: - Reports no concerns. Last lipid panel 04/2017, mostly well controlled with good HDL, mild elevated LDL -  Currently taking Lovastatin 20mg  (3 x weekly), tolerating well without side effects or myalgias Lifestyle - Diet: balanced diet, no specific changes, drinks water - Exercise: active without regular exercise, often walking - Not taking ASA in past, has not tried it. Interested to try ASA 81  Health Maintenance: - Due for routine Hepatitis C screening, will get next year - Last colonoscopy 2009, was told 10 years, next due 1 year in 2019, he will be age [age], recommended repeat screening he will consider this and discuss with wife, who had reservations about the last procedure  Past Medical History:  Diagnosis Date  . Arthritis   . Hyperlipidemia   . Hypertension    No past surgical history on file. Social History   Social History  . Marital status: Married    Spouse name: N/A  . Number of children: N/A  . Years of education: N/A   Occupational History  . Not on file.   Social History Main Topics  . Smoking status: Former Smoker    Packs/day: 3.00    Years: 20.00    Types: Cigarettes    Quit date: 09/16/1985  . Smokeless tobacco: Former NeurosurgeonUser  . Alcohol use 0.6 oz/week    1 Glasses of wine per week     Comment: Only has wine on occassional.   . Drug use: No  . Sexual activity: Not on file   Other Topics Concern  . Not on file   Social History Narrative  . No narrative on file   No family history on file. Current Outpatient  Prescriptions on File Prior to Visit  Medication Sig  . amLODipine (NORVASC) 5 MG tablet Take 1 tablet (5 mg total) by mouth daily.  . finasteride (PROSCAR) 5 MG tablet Take 1 tablet (5 mg total) by mouth daily.  Marland Kitchen lisinopril (PRINIVIL,ZESTRIL) 40 MG tablet Take 1 tablet (40 mg total) by mouth daily.  Marland Kitchen lovastatin (MEVACOR) 20 MG tablet Take one tablet every other night.  . meloxicam (MOBIC) 15 MG tablet Take 1 tablet (15 mg total) by mouth every other day.  . Multiple Vitamins-Minerals (DAILY MULTIVITAMIN PO) Take 1 tablet by mouth daily.  .  tamsulosin (FLOMAX) 0.4 MG CAPS capsule Take 1 capsule (0.4 mg total) by mouth at bedtime.   No current facility-administered medications on file prior to visit.     Review of Systems  Constitutional: Negative for activity change, appetite change, chills, diaphoresis, fatigue, fever and unexpected weight change.  HENT: Negative for congestion, hearing loss and sinus pressure.        Left ear abnormal hearing, fullness  Eyes: Negative for visual disturbance.  Respiratory: Negative for apnea, cough, chest tightness, shortness of breath and wheezing.   Cardiovascular: Negative for chest pain, palpitations and leg swelling.  Gastrointestinal: Negative for abdominal pain, anal bleeding, blood in stool, constipation, diarrhea, nausea and vomiting.  Endocrine: Negative for cold intolerance and polyuria.  Genitourinary: Negative for decreased urine volume, difficulty urinating, dysuria, frequency, hematuria and testicular pain.  Musculoskeletal: Positive for arthralgias (hands, wrist, knees, feet). Negative for back pain, joint swelling, myalgias and neck pain.  Skin: Negative for rash.  Allergic/Immunologic: Negative for environmental allergies.  Neurological: Negative for dizziness, weakness, light-headedness, numbness and headaches.  Hematological: Negative for adenopathy.  Psychiatric/Behavioral: Negative for behavioral problems, dysphoric mood and sleep disturbance. The patient is not nervous/anxious.    Per HPI unless specifically indicated above     Objective:    BP 138/60 (BP Location: Left Arm, Cuff Size: Normal)   Pulse 64   Temp 98 F (36.7 C) (Oral)   Resp 16   Ht 5\' 10"  (1.778 m)   Wt 164 lb (74.4 kg)   BMI 23.53 kg/m   Wt Readings from Last 3 Encounters:  05/17/17 164 lb (74.4 kg)  12/03/16 169 lb (76.7 kg)  09/17/16 166 lb (75.3 kg)    Physical Exam  Constitutional: He is oriented to person, place, and time. He appears well-developed and well-nourished. No distress.    Well-appearing, comfortable, cooperative  HENT:  Head: Normocephalic and atraumatic.  Mouth/Throat: Oropharynx is clear and moist.  Frontal / maxillary sinuses non-tender. Nares patent without purulence or edema. R TM clear without erythema, effusion or bulging. L TM obstructed by dry impacted cerumen. Oropharynx clear without erythema, exudates, edema or asymmetry.  Eyes: Pupils are equal, round, and reactive to light. Conjunctivae and EOM are normal. Right eye exhibits no discharge. Left eye exhibits no discharge.  Neck: Normal range of motion. Neck supple. No thyromegaly present.  Cardiovascular: Normal rate, regular rhythm, normal heart sounds and intact distal pulses.   No murmur heard. Pulmonary/Chest: Effort normal and breath sounds normal. No respiratory distress. He has no wheezes. He has no rales.  Abdominal: Soft. Bowel sounds are normal. He exhibits no distension and no mass. There is no tenderness.  Genitourinary:  Genitourinary Comments: Deferred DRE  Musculoskeletal: Normal range of motion. He exhibits no edema or tenderness.  Upper / Lower Extremities: - Normal muscle tone, strength bilateral upper extremities 5/5, lower extremities 5/5  Bulky appearance bilateral  hands and MCP joints with known OA some stiffness. Non-tender.  Bilateral knees with crepitus on ROM, full active ROM, no effusion, non tender joint lines. No advanced special testing today  Left mid foot tenderness on palpation, otherwise no deformity, edema, ecchymosis.  Lymphadenopathy:    He has no cervical adenopathy.  Neurological: He is alert and oriented to person, place, and time.  Distal sensation intact to light touch all extremities  Skin: Skin is warm and dry. No rash noted. He is not diaphoretic. No erythema.  Psychiatric: He has a normal mood and affect. His behavior is normal.  Well groomed, good eye contact, normal speech and thoughts  Nursing note and vitals  reviewed.  ________________________________________________________ PROCEDURE NOTE Date: 05/17/17 Left Ear Lavage / Cerumen Removal Discussed benefits and risks (including pain / discomforts, dizziness, minor abrasion of ear canal). Verbal consent given by patient. Medication:  carbamide peroxide ear drops, Ear Lavage Solution (warm water + hydrogen peroxide) Performed by Dr Althea CharonKaramalegos Several drops of carbamide peroxide placed in L ear, allowed to sit for few minutes. Ear lavage solution flushed into L ear in attempt to dislodge and remove ear wax. Also used bionix articulating cerumen removal device. Unable to significantly dislodge impacted cerumen  Repeat Ear Exam: Significant softening of ear wax with some removal but still deeper thick impacted cerumen.   Results for orders placed or performed in visit on 05/10/17  COMPLETE METABOLIC PANEL WITH GFR  Result Value Ref Range   Sodium 139 135 - 146 mmol/L   Potassium 4.5 3.5 - 5.3 mmol/L   Chloride 107 98 - 110 mmol/L   CO2 20 20 - 31 mmol/L   Glucose, Bld 87 65 - 99 mg/dL   BUN 28 (H) 7 - 25 mg/dL   Creat 5.001.37 (H) 9.380.70 - 1.18 mg/dL   Total Bilirubin 0.6 0.2 - 1.2 mg/dL   Alkaline Phosphatase 86 40 - 115 U/L   AST 14 10 - 35 U/L   ALT 13 9 - 46 U/L   Total Protein 6.5 6.1 - 8.1 g/dL   Albumin 4.1 3.6 - 5.1 g/dL   Calcium 9.0 8.6 - 18.210.3 mg/dL   GFR, Est African American 60 >=60 mL/min   GFR, Est Non African American 52 (L) >=60 mL/min  Lipid Profile  Result Value Ref Range   Cholesterol 212 (H) <200 mg/dL   Triglycerides 50 <993<150 mg/dL   HDL 84 >71>40 mg/dL   Total CHOL/HDL Ratio 2.5 <5.0 Ratio   VLDL 10 <30 mg/dL   LDL Cholesterol 696118 (H) <100 mg/dL  HgB [id number]  Result Value Ref Range   Hgb A1c MFr Bld 5.4 <5.7 %   Mean Plasma Glucose 108 mg/dL      Assessment & Plan:   Problem List Items Addressed This Visit    Osteoarthritis of multiple joints - Primary    Stable chronic OA/DJD multiple joints knees, hands, feet,  prior problems with shoulders. - Complicated by CKD limiting NSAID use - Improved on Tramadol, Tylenol was ineffective  Plan: 1. Referral to return back to [facility] Dr Martha ClanKrasinski primarily for L foot also knees, advised he may return to me for knee injection as needed in future 2. Discussion on CKD, agree to hold Meloxicam for now, new rx Diclofenac Gel topical for knees/hands sent to pharmacy, may need PA completed, ideally limit use of PO NSAID, may still use Meloxicam 15mg  PRN flare in future 3x or less per week 3. Refilled Tramadol today 50mg  q6  hr PRN #120 +5 refills, checked NCCSRS 4. Follow-up 6 months      Relevant Medications   diclofenac sodium (VOLTAREN) 1 % GEL   traMADol (ULTRAM) 50 MG tablet   aspirin EC 81 MG tablet   Other Relevant Orders   Ambulatory referral to Orthopedic Surgery   Hypercholesteremia    Mostly controlled cholesterol on statin and lifestyle Last lipid panel 04/2017 Calculated ASCVD 10 yr risk score >15%  Plan: 1. Continue current meds - Lovastatin 20mg  3x weekly - only able to tolerate intermittent dosing 2. Counseling on ASCVD risk reduction - new start ASA 81mg  for primary ASCVD risk reduction 3. Encourage improved lifestyle - low carb/cholesterol, reduce portion size, continue improving regular exercise 4. Follow-up lipids 1 year - consider repatha or other meds in future if ASCVD risk increases despite intermittent statin      Relevant Medications   aspirin EC 81 MG tablet   Essential (primary) hypertension    Mildly elevated initial BP, repeat manual check improved. - Home BP readings normal range  Complication with CKD-III   Plan:  1. Continue current BP regimen Amlodipine 5mg  daily, Lisinopril 40mg  daily 2. Encourage improved lifestyle - low sodium diet, regular exercise 3. Continue monitor BP outside office, bring readings to next visit, if persistently >140/90 or new symptoms notify office sooner 4. Follow-up 6 months       Relevant Medications   aspirin EC 81 MG tablet   CKD (chronic kidney disease), stage III    Stable chronic problem, likely secondary to HTN, also history of NSAID use - Cr baseline 1.2 to 1.4, recently mild elevated  Plan: 1. Control BP 2. Limit NSAIDs - on intermittent meloxicam now, try to switch to new rx topical NSAID diclofenac instead 3. Improve hydration 4. Follow-up Cr q 6 months      Chronic pain    Secondary to OA/DJD See A&P      Relevant Medications   traMADol (ULTRAM) 50 MG tablet   aspirin EC 81 MG tablet    Other Visit Diagnoses    Chronic foot pain, left       Relevant Medications   traMADol (ULTRAM) 50 MG tablet   aspirin EC 81 MG tablet   Other Relevant Orders   Ambulatory referral to Orthopedic Surgery   Bilateral chronic knee pain       Relevant Medications   traMADol (ULTRAM) 50 MG tablet   aspirin EC 81 MG tablet   Other Relevant Orders   Ambulatory referral to Orthopedic Surgery   Arthritis       Relevant Medications   traMADol (ULTRAM) 50 MG tablet   aspirin EC 81 MG tablet   Left ear impacted cerumen          Meds ordered this encounter  Medications  . diclofenac sodium (VOLTAREN) 1 % GEL    Sig: Apply 2 g topically 3 (three) times daily as needed. For knees and hands, arthritis    Dispense:  100 g    Refill:  3  . traMADol (ULTRAM) 50 MG tablet    Sig: Take 1 tablet (50 mg total) by mouth every 6 (six) hours as needed for moderate pain.    Dispense:  120 tablet    Refill:  5  . aspirin EC 81 MG tablet    Sig: Take 1 tablet (81 mg total) by mouth daily.    Follow up plan: Return in about 6 months (around 11/17/2017) for Kidney Function.  Future  labs ordered for BMET, Hep C in 6 months  Saralyn Pilar, DO [facility] 05/18/2017, 9:32 AM

## 2017-05-17 NOTE — Patient Instructions (Addendum)
Thank you for coming to the clinic today.  1. Recommend starting baby Aspirin 43m daily, enteric coated 2. Continue Lovastatin 3 x weekly reduce risk of heart attack and stroke 3. NEW anti-inflammatory cream Diclofenac (voltaren) rub into knees hands and feet as needed 1-3 times daily as needed - will need to get prior approval from insurance first - you may still take the Meloxicam few times a week as needed or possibly less to lessen harm on kidneys   Referral back to [facility]   [place] [id number]   Phone: [phone] We can do a knee steroid injection in future if needed.  Refilled Tramadol  Colon Cancer Screening: - For all adults age 70+routine colon cancer screening is highly recommended.     - Recent guidelines [place] starting age [place] 493- Early detection of colon cancer is important, because often there are no warning signs or symptoms, also if found early usually it can be cured. Late stage is hard to treat.  - If you are not interested in Colonoscopy screening (if done and normal you could be cleared for 5 to 10 years until next due), then Cologuard is an excellent alternative for screening test for Colon Cancer. It is highly sensitive for detecting DNA of colon cancer from even the earliest stages. Also, there is NO bowel prep required. - If Cologuard is NEGATIVE, then it is good for 3 years before next due - If Cologuard is POSITIVE, then it is strongly advised to get a Colonoscopy, which allows the GI doctor to locate the source of the cancer or polyp (even very early stage) and treat it by removing it. ------------------------- If you would like to proceed with Cologuard (stool DNA test) (MEDICARE APPROVED - FIT KIT for stool) - FIRST, call your insurance company and tell them you want to check cost of Cologuard tell them CPT Code [id number](it may be completely covered and you  could get for no cost, OR max cost without any coverage is about $600). Also, keep in mind if you do NOT open the kit, and decide not to do the test, you will NOT be charged, you should contact the company if you decide not to do the test. - If you want to proceed, you can notify [place](phone message, [place] or at next visit) and we will order it for you. The test kit will be delivered to you house within about 1 week. Follow instructions to collect sample, you may call the company for any help or questions, 24/7 telephone support at [phone]  Please schedule a Follow-up Appointment to: Return in about 6 months (around 11/17/2017) for Kidney Function.  If you have any other questions or concerns, please feel free to call the clinic or send a message through [place] You may also schedule an earlier appointment if necessary.  Additionally, you may be receiving a survey about your experience at our clinic within a few days to 1 week by e-mail or mail. We value your feedback.  ANobie Putnam DO [place]

## 2017-05-18 ENCOUNTER — Other Ambulatory Visit: Payer: Self-pay | Admitting: Family Medicine

## 2017-05-18 DIAGNOSIS — N183 Chronic kidney disease, stage 3 unspecified: Secondary | ICD-10-CM

## 2017-05-18 DIAGNOSIS — N182 Chronic kidney disease, stage 2 (mild): Secondary | ICD-10-CM | POA: Insufficient documentation

## 2017-05-18 DIAGNOSIS — Z1159 Encounter for screening for other viral diseases: Secondary | ICD-10-CM

## 2017-05-18 DIAGNOSIS — I10 Essential (primary) hypertension: Secondary | ICD-10-CM

## 2017-05-18 NOTE — Assessment & Plan Note (Signed)
Mostly controlled cholesterol on statin and lifestyle Last lipid panel 04/2017 Calculated ASCVD 10 yr risk score >15%  Plan: 1. Continue current meds - Lovastatin 20mg  3x weekly - only able to tolerate intermittent dosing 2. Counseling on ASCVD risk reduction - new start ASA 81mg  for primary ASCVD risk reduction 3. Encourage improved lifestyle - low carb/cholesterol, reduce portion size, continue improving regular exercise 4. Follow-up lipids 1 year - consider repatha or other meds in future if ASCVD risk increases despite intermittent statin

## 2017-05-18 NOTE — Assessment & Plan Note (Signed)
Mildly elevated initial BP, repeat manual check improved. - Home BP readings normal range  Complication with CKD-III   Plan:  1. Continue current BP regimen Amlodipine 5mg  daily, Lisinopril 40mg  daily 2. Encourage improved lifestyle - low sodium diet, regular exercise 3. Continue monitor BP outside office, bring readings to next visit, if persistently >140/90 or new symptoms notify office sooner 4. Follow-up 6 months

## 2017-05-18 NOTE — Assessment & Plan Note (Signed)
Stable chronic OA/DJD multiple joints knees, hands, feet, prior problems with shoulders. - Complicated by CKD limiting NSAID use - Improved on Tramadol, Tylenol was ineffective  Plan: 1. Referral to return back to [facility] Dr Martha ClanKrasinski primarily for L foot also knees, advised he may return to me for knee injection as needed in future 2. Discussion on CKD, agree to hold Meloxicam for now, new rx Diclofenac Gel topical for knees/hands sent to pharmacy, may need PA completed, ideally limit use of PO NSAID, may still use Meloxicam 15mg  PRN flare in future 3x or less per week 3. Refilled Tramadol today 50mg  q6 hr PRN #120 +5 refills, checked [place] 4. Follow-up 6 months

## 2017-05-18 NOTE — Assessment & Plan Note (Signed)
Stable chronic problem, likely secondary to HTN, also history of NSAID use - Cr baseline 1.2 to 1.4, recently mild elevated  Plan: 1. Control BP 2. Limit NSAIDs - on intermittent meloxicam now, try to switch to new rx topical NSAID diclofenac instead 3. Improve hydration 4. Follow-up Cr q 6 months

## 2017-05-18 NOTE — Assessment & Plan Note (Signed)
Secondary to OA/DJD See A&P 

## 2017-06-07 DIAGNOSIS — M79672 Pain in left foot: Secondary | ICD-10-CM | POA: Diagnosis not present

## 2017-06-07 DIAGNOSIS — M19072 Primary osteoarthritis, left ankle and foot: Secondary | ICD-10-CM | POA: Diagnosis not present

## 2017-06-21 ENCOUNTER — Encounter: Payer: Self-pay | Admitting: Family Medicine

## 2017-06-21 ENCOUNTER — Ambulatory Visit (INDEPENDENT_AMBULATORY_CARE_PROVIDER_SITE_OTHER): Payer: Medicare HMO | Admitting: Family Medicine

## 2017-06-21 VITALS — BP 108/66 | HR 74 | Temp 98.0°F | Resp 16 | Ht 70.0 in | Wt 161.0 lb

## 2017-06-21 DIAGNOSIS — M150 Primary generalized (osteo)arthritis: Secondary | ICD-10-CM | POA: Diagnosis not present

## 2017-06-21 DIAGNOSIS — M25562 Pain in left knee: Secondary | ICD-10-CM

## 2017-06-21 DIAGNOSIS — Z23 Encounter for immunization: Secondary | ICD-10-CM

## 2017-06-21 DIAGNOSIS — M159 Polyosteoarthritis, unspecified: Secondary | ICD-10-CM

## 2017-06-21 DIAGNOSIS — G8929 Other chronic pain: Secondary | ICD-10-CM | POA: Diagnosis not present

## 2017-06-21 MED ORDER — LIDOCAINE HCL (PF) 1 % IJ SOLN
4.0000 mL | Freq: Once | INTRAMUSCULAR | Status: DC
  Administered 2017-06-21: 4 mL

## 2017-06-21 MED ORDER — LIDOCAINE HCL (PF) 1 % IJ SOLN: 4.0000 mL | Freq: Once | INTRAMUSCULAR | Status: DC

## 2017-06-21 MED ORDER — METHYLPREDNISOLONE ACETATE 40 MG/ML IJ SUSP
40.0000 mg | Freq: Once | INTRAMUSCULAR | Status: AC
  Administered 2017-06-21: 40 mg via INTRA_ARTICULAR

## 2017-06-21 NOTE — Patient Instructions (Addendum)
Thank you for coming to the clinic today.  1. You received a Left Knee Joint steroid injection today. - Lidocaine numbing medicine may ease the pain initially for a few hours until it wears off - As discussed, you may experience a "steroid flare" this evening or within 24-48 hours, anytime medicine is injected into an inflamed joint it can cause the pain to get worse temporarily - Everyone responds differently to these injections, it depends on the patient and the severity of the joint problem, it may provide anywhere from days to weeks, to months of relief. Ideal response is >6 months relief - Try to take it easy for next 1-2 days, avoid over activity and strain on joint (limit walking for knee) - Recommend the following:   - For swelling - rest, compression sleeve / ACE wrap, elevation, and ice packs as needed for first few days   - For pain in future may use heating pad or moist heat as needed  Please schedule a Follow-up Appointment to: Return in about 5 months (around 11/21/2017) for keep already scheduled apt in February, OA/DJD, Labs, CKD.  If you have any other questions or concerns, please feel free to call the clinic or send a message through MyChart. You may also schedule an earlier appointment if necessary.  Additionally, you may be receiving a survey about your experience at our clinic within a few days to 1 week by e-mail or mail. We value your feedback.  Saralyn PilarAlexander Amaani Guilbault, DO [facility]

## 2017-06-21 NOTE — Progress Notes (Addendum)
Subjective:    Patient ID: Jerry Simmons, male    DOB: 03/07/1946, 71 y.o.   MRN: [id number]  Jerry AlbertWayne Cleaver is a 71 y.o. male presenting on 06/21/2017 for Knee Pain (left side cortisone shot)   HPI  Osteoarthritis, Multiple joints (knees, hands, feet) / Chronic Knee Pain - Last visit with me 05/17/17, for same problem, treated with topical Diclofenac Gel and limit meloxicam, increase Tylenol and rx Tramadol given, also refer back to [facility] Dr Martha ClanKrasinski for L foot may consider injection, see prior notes for background information. - Interval update with patient saw Dr Martha ClanKrasinski on 8/24, given Medrol dose pak and [facility] for feet - Today patient reports he is not sure if steroid dose pak is helping much. He may schedule with podiatry. - Taking topical diclofenac with notable relief foot and knees, limiting meloxicam occasionally - asking about CKD, acknowledges should stop diet soda, drink more water - He would like L knee injection (last done 12/2016, previously had several inj in R knee x 3)  Health maintenance: - Due for Flu Shot, will receive today  Social History  Substance Use Topics  . Smoking status: Former Smoker    Packs/day: 3.00    Years: 20.00    Types: Cigarettes    Quit date: 09/16/1985  . Smokeless tobacco: Former NeurosurgeonUser  . Alcohol use 0.6 oz/week    1 Glasses of wine per week     Comment: Only has wine on occassional.     Review of Systems Per HPI unless specifically indicated above     Objective:    BP 108/66   Pulse 74   Temp 98 F (36.7 C) (Oral)   Resp 16   Ht 5\' 10"  (1.778 m)   Wt 161 lb (73 kg)   BMI 23.10 kg/m   Wt Readings from Last 3 Encounters:  06/21/17 161 lb (73 kg)  05/17/17 164 lb (74.4 kg)  12/03/16 169 lb (76.7 kg)    Physical Exam  Constitutional: He is oriented to person, place, and time. He appears well-developed and well-nourished. No distress.  Well-appearing, comfortable, cooperative    HENT:  Head: Normocephalic and atraumatic.  Mouth/Throat: Oropharynx is clear and moist.  Eyes: Conjunctivae are normal. Right eye exhibits no discharge. Left eye exhibits no discharge.  Cardiovascular: Normal rate.   Pulmonary/Chest: Effort normal.  Musculoskeletal: He exhibits no edema.  Bilateral Knees Inspection: Mild bulky appearance bilateral knees, symmetrical. No ecchymosis or effusion. Palpation: Mild +TTP Left knee only medial joint line. Mild crepitus ROM: Slightly limited flexion but overall still intact active ROM bilaterally Special Testing: Lachman / Valgus/Varus tests negative with intact ligaments (ACL, MCL, LCL). Strength: 5/5 intact knee flex/ext, ankle dorsi/plantarflex Neurovascular: distally intact sensation light touch and pulses  Neurological: He is alert and oriented to person, place, and time.  Skin: Skin is warm and dry. No rash noted. He is not diaphoretic. No erythema.  Psychiatric: He has a normal mood and affect. His behavior is normal.  Well groomed, good eye contact, normal speech and thoughts  Nursing note and vitals reviewed.    ________________________________________________________ PROCEDURE NOTE Date: 06/21/17 Left Knee Steroid injection Discussed benefits and risks (including pain, bleeding, infection, steroid flare). Verbal consent given by patient. Medication:  1 cc Depo-medrol 40mg  and 4 cc Lidocaine 2% without epi Time Out taken  Landmarks identified. Area cleansed with alcohol wipes.Using 21 gauge and 1, 1/2 inch needle, Left knee space was injected (  with above listed medication) via lateral approach cold spray used for superficial anesthetic.Sterile bandage placed.Patient tolerated procedure well without bleeding or paresthesias.No complications.   Results for orders placed or performed in visit on 05/10/17  COMPLETE METABOLIC PANEL WITH GFR  Result Value Ref Range   Sodium 139 135 - 146 mmol/L   Potassium 4.5 3.5 - 5.3 mmol/L    Chloride 107 98 - 110 mmol/L   CO2 20 20 - 31 mmol/L   Glucose, Bld 87 65 - 99 mg/dL   BUN 28 (H) 7 - 25 mg/dL   Creat 1.61 (H) 0.96 - 1.18 mg/dL   Total Bilirubin 0.6 0.2 - 1.2 mg/dL   Alkaline Phosphatase 86 40 - 115 U/L   AST 14 10 - 35 U/L   ALT 13 9 - 46 U/L   Total Protein 6.5 6.1 - 8.1 g/dL   Albumin 4.1 3.6 - 5.1 g/dL   Calcium 9.0 8.6 - 04.5 mg/dL   GFR, Est African American 60 >=60 mL/min   GFR, Est Non African American 52 (L) >=60 mL/min  Lipid Profile  Result Value Ref Range   Cholesterol 212 (H) <200 mg/dL   Triglycerides 50 <409 mg/dL   HDL 84 >81 mg/dL   Total CHOL/HDL Ratio 2.5 <5.0 Ratio   VLDL 10 <30 mg/dL   LDL Cholesterol 191 (H) <100 mg/dL  HgB Y7W  Result Value Ref Range   Hgb A1c MFr Bld 5.4 <5.7 %   Mean Plasma Glucose 108 mg/dL      Assessment & Plan:   Problem List Items Addressed This Visit    Osteoarthritis of multiple joints - Primary    Stable chronic OA/DJD multiple joints knees, hands, feet, prior problems with shoulders. - Complicated by CKD limiting NSAID use - Improved on Tramadol, Diclofenac topical, Tylenol was ineffective - Followed by [facility] Dr [name], referred to [facility]iat[facility] Foot (orthotics, inj)  Plan: 1. Received L knee steroid injection today, tolerated well, see procedure note 2. Discussion on CKD again, continue diclofenac topical, tramadol PRN, use meloxicam rarely, limit sodas, inc water 3. Follow-up with Ortho vs Podiatry 4. Follow-up with me 5 months, may consider repeat knee inj      Relevant Medications   methylPREDNISolone acetate (DEPO-MEDROL) injection 40 mg (Completed)   lidocaine (PF) (XYLOCAINE) 1 % injection 4 mL    Other Visit Diagnoses    Chronic pain of left knee       Relevant Medications   methylPREDNISolone acetate (DEPO-MEDROL) injection 40 mg (Completed)   lidocaine (PF) (XYLOCAINE) 1 % injection 4 mL   Needs flu shot       Relevant Orders   Flu vaccine HIGH DOSE PF (Completed)        Meds ordered this encounter  Medications  . methylPREDNISolone acetate (DEPO-MEDROL) injection 40 mg  . DISCONTD: lidocaine (PF) (XYLOCAINE) 1 % injection 4 mL  . lidocaine (PF) (XYLOCAINE) 1 % injection 4 mL    Follow up plan: Return in about 5 months (around 11/21/2017) for keep already scheduled apt in February, OA/DJD, Labs, CKD.  Saralyn Pilar, DO [facility] 06/22/2017, 8:03 AM

## 2017-06-22 NOTE — Assessment & Plan Note (Signed)
Stable chronic OA/DJD multiple joints knees, hands, feet, prior problems with shoulders. - Complicated by CKD limiting NSAID use - Improved on Tramadol, Diclofenac topical, Tylenol was ineffective - Followed by [facility] Dr Martha ClanKrasinski, [facility] (orthotics, inj)  Plan: 1. Received L knee steroid injection today, tolerated well, see procedure note 2. Discussion on CKD again, continue diclofenac topical, tramadol PRN, use meloxicam rarely, limit sodas, inc water 3. Follow-up with Ortho vs Podiatry 4. Follow-up with me 5 months, may consider repeat knee inj

## 2017-08-26 ENCOUNTER — Encounter: Payer: Self-pay | Admitting: Family Medicine

## 2017-08-26 ENCOUNTER — Ambulatory Visit: Payer: Medicare HMO | Admitting: Family Medicine

## 2017-08-26 VITALS — BP 110/70 | HR 70 | Temp 98.2°F | Resp 16 | Ht 70.0 in | Wt 167.0 lb

## 2017-08-26 DIAGNOSIS — M159 Polyosteoarthritis, unspecified: Secondary | ICD-10-CM

## 2017-08-26 DIAGNOSIS — M150 Primary generalized (osteo)arthritis: Secondary | ICD-10-CM | POA: Diagnosis not present

## 2017-08-26 DIAGNOSIS — N183 Chronic kidney disease, stage 3 unspecified: Secondary | ICD-10-CM

## 2017-08-26 NOTE — Assessment & Plan Note (Signed)
Stable chronic OA/DJD multiple joints knees, hands, feet, prior problems with shoulders. - Complicated by CKD limiting NSAID use - Improved on Tramadol, Diclofenac topical, tylenol limited effective only - Followed by [facility] Dr Martha ClanKrasinski, [facility] (orthotics, inj) - S/p last L knee steroid injection 06/21/17  Plan: 1. Discussion again today on managing his arthritis pain meds and affect on CKD, see A&P - Continue current plan Meloxicam 15mg  TWICE weekly q 3-4 days - Continue Tramadol 50mg  QID, Diclofenac topical gel 1-2 times daily - INCREASE Tylenol Ext Str 500mg  to up to 2 tabs TID more regular, adjust dose - Will see what Cr trend is for CKD in February on meloxicam twice weekly, then consider stop or reduce further, may consider other meds such as gabapentin, baclofen, half dose Meloxicam or higher Tramadol 2. Follow-up with Ortho vs Podiatry 3. Follow-up February 2019

## 2017-08-26 NOTE — Progress Notes (Signed)
Subjective:    Patient ID: Jerry Simmons, male    DOB: Feb 02, 1946, 71 y.o.   MRN: [id number]  Jerry Simmons is a 71 y.o. male presenting on 08/26/2017 for Results (question about decreased kidney function, CKD-III.)   HPI   Osteoarthritis, Multiple joints (knees, hands, feet) / Chronic Knee Pain - Last visit with me 06/21/17, for same problem also followed by Dr Martha ClanKrasinski ([facility]), on 9/7 he had a L knee steroid joint injection by me, also on Diclofenac topical, increased Tylenol, and rx Tramadol, and continued Meloxicam PRN. see prior notes for background information. - Today patient reports that he would like to ask some questions about Meloxicam and Kidney function, see discussion on CKD below. - States that his arthritis multiple joints is fairly well controlled on several medicines - Currently taking Meloxicam 15mg  TWICE a week, or every 3-4 days, one pill will last for 3 days and usually works very well for him, much improved function and less pain - Also taking Tramadol 50mg  QID, Diclofenac gel topical 1-2x times most days (hands, feet, knees), Tylenol Ext Str 500mg  2-3 times daily - He also started taking Glucosamine 1500mg  and Chondroitin 1200mg  twice daily - Denies joint pain, aching, stiffness, redness, swelling, injury  FOLLOW-UP CKD-III - Reviewed prior history with CKD-II to III over past >5 years, [facility], showed Cr trend 1.2 stable, within past 1 year, has had fluctuating Cr from 1.1 to 1.37, with now GFR < 60, since 04/2017. This was discussed last apt, but patient now returns today with several questions again to review this. - He is asking about Meloxicam dosing 2 x weekly if this is safe, and about other meds affecting his kidneys - See above meds for arthritis. He does also state many years ago prior doctor put him on vicodin for arthritis and then changed to high dose Aleve for long time, thinks this may have affected his kidneys - He was  unsure if Tylenol affected kidney - He is asking about chondroitin, glucosamine supplements if they affect - He is already taking Lisinopril 40mg  daily for HTN and renal protection - No longer drinking diet pepsi soda, he does drink tea regularly and less water - Also Insomnia - takes Tylenol-PM - Never seen Nephrology - Denies reduced urine output, dysuria, hematuria, nausea vomiting abdominal or flank pain  Health Maintenance: UTD Flu Shot 06/21/17  Depression screen [facility] 2/9 12/03/2016 10/25/2015 03/18/2015  Decreased Interest 0 0 0  Down, Depressed, Hopeless 0 0 0  PHQ - 2 Score 0 0 0    Social History   Tobacco Use  . Smoking status: Former Smoker    Packs/day: 3.00    Years: 20.00    Pack years: 60.00    Types: Cigarettes    Last attempt to quit: 09/16/1985    Years since quitting: 31.9  . Smokeless tobacco: Former Engineer, waterUser  Substance Use Topics  . Alcohol use: Yes    Alcohol/week: 0.6 oz    Types: 1 Glasses of wine per week    Comment: Only has wine on occassional.   . Drug use: No    Review of Systems Per HPI unless specifically indicated above     Objective:    BP 110/70   Pulse 70   Temp 98.2 F (36.8 C) (Oral)   Resp 16   Ht 5\' 10"  (1.778 m)   Wt 167 lb (75.8 kg)   BMI 23.96 kg/m   Wt Readings  from Last 3 Encounters:  08/26/17 167 lb (75.8 kg)  06/21/17 161 lb (73 kg)  05/17/17 164 lb (74.4 kg)    Physical Exam  Constitutional: He is oriented to person, place, and time. He appears well-developed and well-nourished. No distress.  Well-appearing, comfortable, cooperative  HENT:  Head: Normocephalic and atraumatic.  Mouth/Throat: Oropharynx is clear and moist.  Eyes: Conjunctivae are normal. Right eye exhibits no discharge. Left eye exhibits no discharge.  Cardiovascular: Normal rate.  Pulmonary/Chest: Effort normal.  Musculoskeletal: He exhibits no edema.  Neurological: He is alert and oriented to person, place, and time.  Skin: Skin is warm and dry. No  rash noted. He is not diaphoretic. No erythema.  Psychiatric: He has a normal mood and affect. His behavior is normal.  Well groomed, good eye contact, normal speech and thoughts  Nursing note and vitals reviewed.  Results for orders placed or performed in visit on 05/10/17  COMPLETE METABOLIC PANEL WITH GFR  Result Value Ref Range   Sodium 139 135 - 146 mmol/L   Potassium 4.5 3.5 - 5.3 mmol/L   Chloride 107 98 - 110 mmol/L   CO2 20 20 - 31 mmol/L   Glucose, Bld 87 65 - 99 mg/dL   BUN 28 (H) 7 - 25 mg/dL   Creat 4.01 (H) 0.27 - 1.18 mg/dL   Total Bilirubin 0.6 0.2 - 1.2 mg/dL   Alkaline Phosphatase 86 40 - 115 U/L   AST 14 10 - 35 U/L   ALT 13 9 - 46 U/L   Total Protein 6.5 6.1 - 8.1 g/dL   Albumin 4.1 3.6 - 5.1 g/dL   Calcium 9.0 8.6 - 25.3 mg/dL   GFR, Est African American 60 >=60 mL/min   GFR, Est Non African American 52 (L) >=60 mL/min  Lipid Profile  Result Value Ref Range   Cholesterol 212 (H) <200 mg/dL   Triglycerides 50 <664 mg/dL   HDL 84 >40 mg/dL   Total CHOL/HDL Ratio 2.5 <5.0 Ratio   VLDL 10 <30 mg/dL   LDL Cholesterol 347 (H) <100 mg/dL  HgB Q2V  Result Value Ref Range   Hgb A1c MFr Bld 5.4 <5.7 %   Mean Plasma Glucose 108 mg/dL      Assessment & Plan:   Problem List Items Addressed This Visit    CKD (chronic kidney disease), stage III (HCC)    Stable chronic problem, likely secondary to HTN, also history of NSAID use - Cr baseline 1.2 to 1.4, recently mild elevated to 1.37 on last with GFR now < 60, consistent with Stage III, prior history Stage II since 2013, possibly earlier  Plan: 1. Discussion today again on CKD diagnosis and management and prognosis - given that BP controlled, and now really limiting NSAIDs, suspect CKD will remain stable for long time and should not dramatically worsen - provided reassurance today - See A&P for ortho to adjust NSAID dosing, for now continue intermittent meloxicam - Increase Tylenol to compensate - Increase  hydration with water, drink less tea, remain off diet soda 2. Follow-up Cr in February 2019 as scheduled, likely q 6 lab Cr trend, and future consider Nephrology if needed      Osteoarthritis of multiple joints - Primary    Stable chronic OA/DJD multiple joints knees, hands, feet, prior problems with shoulders. - Complicated by CKD limiting NSAID use - Improved on Tramadol, Diclofenac topical, tylenol limited effective only - Followed by [facility] Dr [name], referred to [facility] Podiatry Foot (  orthotics, inj) - S/p last L knee steroid injection 06/21/17  Plan: 1. Discussion again today on managing his arthritis pain meds and affect on CKD, see A&P - Continue current plan Meloxicam 15mg  TWICE weekly q 3-4 days - Continue Tramadol 50mg  QID, Diclofenac topical gel 1-2 times daily - INCREASE Tylenol Ext Str 500mg  to up to 2 tabs TID more regular, adjust dose - Will see what Cr trend is for CKD in February on meloxicam twice weekly, then consider stop or reduce further, may consider other meds such as gabapentin, baclofen, half dose Meloxicam or higher Tramadol 2. Follow-up with Ortho vs Podiatry 3. Follow-up February 2019         Meds ordered this encounter  Medications  . OMEPRAZOLE PO  . CHONDROITIN SULFATE A PO    Sig: Take 1,200 mg 2 (two) times daily by mouth.  . Glucosamine-Chondroit-Vit C-Mn (GLUCOSAMINE 1500 COMPLEX PO)    Sig: Take 1,500 mg 2 (two) times daily by mouth.    Follow up plan: Return in about 3 months (around 11/15/2017) for labs, CKD.  Saralyn PilarAlexander Maalle Starrett, DO [facility] 08/26/2017, 10:08 AM

## 2017-08-26 NOTE — Patient Instructions (Addendum)
Thank you for coming to the clinic today.  1. Keep up the good work overall  Do not worry about kidneys, this is a very slow process, and I think your results are fairly stable overall  2. Increase more water, less tea  3. Increase Tylenol on a more regular basis, (tylenol is safe for kidney it does not affect), take   Recommend to start taking Tylenol Extra Strength 500mg  tabs - take 1 to 2 tabs per dose (max 1000mg ) every 6-8 hours for pain. In the future you can repeat the same everyday Tylenol course for 1-2 weeks at a time.  Continue Tramadol and Diclofenac gel  You can continue taking Meloxicam 15mg  twice weekly for now until we re-check blood work. If needed may try to scale back Meloxicam to ONE pill a week, or can try cutting in half  Please schedule a Follow-up Appointment to: Return in about 3 months (around 11/15/2017) for labs, CKD.  If you have any other questions or concerns, please feel free to call the clinic or send a message through MyChart. You may also schedule an earlier appointment if necessary.  Additionally, you may be receiving a survey about your experience at our clinic within a few days to 1 week by e-mail or mail. We value your feedback.  Saralyn PilarAlexander Karamalegos, DO [facility]

## 2017-08-26 NOTE — Assessment & Plan Note (Signed)
Stable chronic problem, likely secondary to HTN, also history of NSAID use - Cr baseline 1.2 to 1.4, recently mild elevated to 1.37 on last with GFR now < 60, consistent with Stage III, prior history Stage II since 2013, possibly earlier  Plan: 1. Discussion today again on CKD diagnosis and management and prognosis - given that BP controlled, and now really limiting NSAIDs, suspect CKD will remain stable for long time and should not dramatically worsen - provided reassurance today - See A&P for ortho to adjust NSAID dosing, for now continue intermittent meloxicam - Increase Tylenol to compensate - Increase hydration with water, drink less tea, remain off diet soda 2. Follow-up Cr in February 2019 as scheduled, likely q 6 lab Cr trend, and future consider Nephrology if needed

## 2017-09-03 ENCOUNTER — Other Ambulatory Visit: Payer: Self-pay

## 2017-09-03 DIAGNOSIS — I10 Essential (primary) hypertension: Secondary | ICD-10-CM

## 2017-09-03 MED ORDER — LISINOPRIL 40 MG PO TABS: 40.0000 mg | ORAL_TABLET | Freq: Every day | ORAL | 3 refills | Status: DC

## 2017-11-15 ENCOUNTER — Other Ambulatory Visit: Payer: Medicare HMO

## 2017-11-15 ENCOUNTER — Other Ambulatory Visit: Payer: Self-pay | Admitting: Family Medicine

## 2017-11-15 DIAGNOSIS — N183 Chronic kidney disease, stage 3 unspecified: Secondary | ICD-10-CM

## 2017-11-15 DIAGNOSIS — I10 Essential (primary) hypertension: Secondary | ICD-10-CM

## 2017-11-15 DIAGNOSIS — Z1159 Encounter for screening for other viral diseases: Secondary | ICD-10-CM

## 2017-11-15 DIAGNOSIS — Z119 Encounter for screening for infectious and parasitic diseases, unspecified: Secondary | ICD-10-CM

## 2017-11-17 LAB — BASIC METABOLIC PANEL
BUN/Creatinine Ratio: 19 (calc) (ref 6–22)
BUN: 26 mg/dL — AB (ref 7–25)
CO2: 17 mmol/L — ABNORMAL LOW (ref 20–32)
CREATININE: 1.34 mg/dL — AB (ref 0.70–1.18)
Calcium: 9.4 mg/dL (ref 8.6–10.3)
Chloride: 108 mmol/L (ref 98–110)
GLUCOSE: 88 mg/dL (ref 65–99)
Potassium: 4.6 mmol/L (ref 3.5–5.3)
Sodium: 140 mmol/L (ref 135–146)

## 2017-11-17 LAB — HEPATITIS C ANTIBODY
Hepatitis C Ab: NONREACTIVE
SIGNAL TO CUT-OFF: 0.47 (ref <1.00)

## 2017-11-18 ENCOUNTER — Telehealth: Payer: Self-pay | Admitting: Family Medicine

## 2017-11-18 NOTE — Telephone Encounter (Signed)
Pt declined awv, msg sent to Dr. KKirtland Bouchard

## 2017-11-21 ENCOUNTER — Telehealth: Payer: Self-pay | Admitting: Family Medicine

## 2017-11-21 NOTE — Telephone Encounter (Signed)
Refill request from [facility] for new rx Tramadol, since out of refills now. Patient has [facility] listed as pharmacy. He was continued on long-term Tramadol for chronic pain, OA/DJD. See prior note. I have checked [place] CSRS PMP AWARE showed appropriate rx fills for Tramadol.  I attempted to call patient to clarify which pharmacy he would like the tramadol at and advised him that he has an appointment tomorrow Friday 11/22/17, we can handle the refill at that time.  ------------------------------------------  If he calls back we need to know correct pharmacy, and let him know we will wait until I see him in office to refill.  Saralyn PilarAlexander Karamalegos, DO [facility] 11/21/2017, 12:35 PM

## 2017-11-22 ENCOUNTER — Ambulatory Visit: Payer: Commercial Managed Care - HMO | Admitting: Family Medicine

## 2017-11-22 ENCOUNTER — Ambulatory Visit (INDEPENDENT_AMBULATORY_CARE_PROVIDER_SITE_OTHER): Payer: Medicare HMO | Admitting: Family Medicine

## 2017-11-22 ENCOUNTER — Encounter: Payer: Self-pay | Admitting: Family Medicine

## 2017-11-22 VITALS — BP 122/60 | HR 67 | Temp 98.2°F | Resp 16 | Ht 70.0 in | Wt 169.0 lb

## 2017-11-22 DIAGNOSIS — M150 Primary generalized (osteo)arthritis: Secondary | ICD-10-CM

## 2017-11-22 DIAGNOSIS — N400 Enlarged prostate without lower urinary tract symptoms: Secondary | ICD-10-CM | POA: Diagnosis not present

## 2017-11-22 DIAGNOSIS — G8929 Other chronic pain: Secondary | ICD-10-CM | POA: Diagnosis not present

## 2017-11-22 DIAGNOSIS — N183 Chronic kidney disease, stage 3 unspecified: Secondary | ICD-10-CM

## 2017-11-22 DIAGNOSIS — M159 Polyosteoarthritis, unspecified: Secondary | ICD-10-CM

## 2017-11-22 MED ORDER — TRAMADOL HCL 50 MG PO TABS: 50.0000 mg | ORAL_TABLET | Freq: Four times a day (QID) | ORAL | 5 refills | Status: DC | PRN

## 2017-11-22 NOTE — Progress Notes (Signed)
Subjective:    Patient ID: Jerry Simmons, male    DOB: Jun 19, 1946, 72 y.o.   MRN: [id number]  Jerry Simmons is a 72 y.o. male presenting on 11/22/2017 for Chronic Kidney Disease   HPI  Osteoarthritis, Multiple joints (knees, hands, feet)/ Chronic Knee Pain - Last visit with me 08/26/17, for same problem also followed by Dr [name] ([facility]), has had knee injections, among other therapy including Tylenol, Tramadol, Meloxicam and topical Diclofenac, see prior notes for background information. - Today patient reports his knees are relatively stable without worsening. His current regimen seems to work well, but asking if he could take an extra meloxicam for a 3rd dose each week. - Currently taking Meloxicam 15mg  TWICE a week (M-F), or every 3-4 days, one pill will last for 3 days and usually works very well for him, much improved function and less pain - Also taking Tramadol 50mg  QID, Diclofenac gel topical 1-2x times most days (hands, feet, knees), Tylenol Ext Str 500mg  2-3 times daily -Also taking Glucosamine 1500mg  and Chondroitin 1200mg  twice daily - Denies joint pain, aching, stiffness, redness, swelling, injury  FOLLOW-UP CKD-III - Last visit reviewed history of CKD, 08/2017, with CKD-II to III over past >5 years, reviewed old labs from [facility] 2013, showed Cr trend 1.2 stable, within past 1 year, has had fluctuating Cr from 1.1 to 1.37 more recently. - Now last lab done 11/15/17 - Cr 1.34 (from 1.37) - He is asking about Meloxicam dosing 2 x weekly if this is safe and if he can add 3rd dose, and about other meds affecting his kidneys - See above meds for arthritis. He does also state many years ago prior doctor put him on vicodin for arthritis and then changed to high dose Aleve for long time, thinks this may have affected his kidneys - He is already taking Lisinopril 40mg  daily for HTN and renal protection - No longer drinking diet pepsi soda, he does drink tea regularly and less water  (especially less water in winter) - Also Insomnia - takes Tylenol-PM - Never seen Nephrology - Denies reduced urine output, dysuria, hematuria, nausea vomiting abdominal or flank pain  Update - BPH - he has reduced Flomax now only PRN use, seems to be less effective, as finasteride is helping more. Not ready for new rx, will be PRN when requests it  Depression screen [facility] 2/9 11/22/2017 12/03/2016 10/25/2015  Decreased Interest 0 0 0  Down, Depressed, Hopeless 0 0 0  PHQ - 2 Score 0 0 0    Social History   Tobacco Use  . Smoking status: Former Smoker    Packs/day: 3.00    Years: 20.00    Pack years: 60.00    Types: Cigarettes    Last attempt to quit: 09/16/1985    Years since quitting: 32.2  . Smokeless tobacco: Former Engineer, water Use Topics  . Alcohol use: Yes    Alcohol/week: 0.6 oz    Types: 1 Glasses of wine per week    Comment: Only has wine on occassional.   . Drug use: No    Review of Systems Per HPI unless specifically indicated above     Objective:    BP 122/60   Pulse 67   Temp 98.2 F (36.8 C) (Oral)   Resp 16   Ht 5\' 10"  (1.778 m)   Wt 169 lb (76.7 kg)   BMI 24.25 kg/m   Wt Readings from Last 3 Encounters:  11/22/17 169 lb (76.7  kg)  08/26/17 167 lb (75.8 kg)  06/21/17 161 lb (73 kg)    Physical Exam  Constitutional: He is oriented to person, place, and time. He appears well-developed and well-nourished. No distress.  Well-appearing, comfortable, cooperative  HENT:  Head: Normocephalic and atraumatic.  Mouth/Throat: Oropharynx is clear and moist.  Eyes: Conjunctivae are normal. Right eye exhibits no discharge. Left eye exhibits no discharge.  Cardiovascular: Normal rate and intact distal pulses.  Pulmonary/Chest: Effort normal and breath sounds normal. No respiratory distress. He has no wheezes. He has no rales.  Musculoskeletal: Normal range of motion. He exhibits no edema.  Neurological: He is alert and oriented to person, place, and time.    Skin: Skin is warm and dry. No rash noted. He is not diaphoretic. No erythema.  Psychiatric: He has a normal mood and affect. His behavior is normal.  Well groomed, good eye contact, normal speech and thoughts  Nursing note and vitals reviewed.     Results for orders placed or performed in visit on 11/15/17  Basic Metabolic Panel (BMET)  Result Value Ref Range   Glucose, Bld 88 65 - 99 mg/dL   BUN 26 (H) 7 - 25 mg/dL   Creat 2.95 (H) 6.21 - 1.18 mg/dL   BUN/Creatinine Ratio 19 6 - 22 (calc)   Sodium 140 135 - 146 mmol/L   Potassium 4.6 3.5 - 5.3 mmol/L   Chloride 108 98 - 110 mmol/L   CO2 17 (L) 20 - 32 mmol/L   Calcium 9.4 8.6 - 10.3 mg/dL  Hepatitis C Antibody  Result Value Ref Range   Hepatitis C Ab NON-REACTIVE NON-REACTI   SIGNAL TO CUT-OFF 0.47 <1.00       Assessment & Plan:   Problem List Items Addressed This Visit    BPH with obstruction/lower urinary tract symptoms    Stable Seems tamsulosin less effective now Continue Finasteride May reduce Tamsulosin to PRN - has plenty no new rx      Relevant Medications   tamsulosin (FLOMAX) 0.4 MG CAPS capsule   Chronic pain   Relevant Medications   traMADol (ULTRAM) 50 MG tablet   meloxicam (MOBIC) 15 MG tablet (Start on 11/25/2017)   CKD (chronic kidney disease), stage III (HCC) - Primary    Stable chronic problem CKD-III, likely secondary to HTN, also history of NSAID use - Cr baseline 1.2 to 1.4, recently - Last Cr 1.34 (from 1.37)   Plan: 1. Discussion today again on CKD diagnosis and management and prognosis - given that BP controlled, and now really limiting NSAIDs, suspect CKD will remain stable for long time and should not dramatically worsen - provided reassurance today - See A&P for ortho to adjust NSAID dosing, for now continue intermittent meloxicam - may slightly increase to 3 x per week if truly need in future - Continue topical diclofenac, Tylenol, Tramadol - Increase hydration with water, drink less  tea, remain off diet soda 2. Follow-up Cr trend - next in 6 months with annual - decided to defer Nephrology still, reconsider if Cr persistent >1.3 or higher >1.4      Osteoarthritis of multiple joints    Stable chronic OA/DJD multiple joints knees, hands, feet, prior problems with shoulders. - Complicated by CKD limiting NSAID use - improved on max conservative therapy - Followed by [facility] Dr [name], referred to [facility] Foot (orthotics, inj) - S/p last L knee steroid injection 06/21/17  Plan: 1. Discussion again today on managing his arthritis pain meds  and affect on CKD, see A&P - Continue current plan Meloxicam 15mg  TWICE weekly q 3-4 days - advised he may add a THIRD dose rarely 1-2 x a month - Refilled Tramadol 50mg  QID - will likely need [facility] Contract at future visit - Continue Diclofenac topical gel 1-2 times daily - Continue inc dose Tylenol 2. Follow-up with Ortho vs Podiatry 3. Follow-up 6 months - adjust meds accordingly will trend Cr      Relevant Medications   traMADol (ULTRAM) 50 MG tablet   meloxicam (MOBIC) 15 MG tablet (Start on 11/25/2017)      Meds ordered this encounter  Medications  . traMADol (ULTRAM) 50 MG tablet    Sig: Take 1 tablet (50 mg total) by mouth every 6 (six) hours as needed for moderate pain.    Dispense:  120 tablet    Refill:  5      Follow up plan: Return in about 6 months (around 05/22/2018) for Annual Physical.  Future labs ordered for 05/2018  A total of >25 minutes was spent face-to-face with this patient. Greater than 50% of this time was spent in counseling on CKD diagnosis progression complications prevention and treatment.  Saralyn PilarAlexander Arliene Rosenow, DO [facility] 11/23/2017, 7:59 AM

## 2017-11-22 NOTE — Patient Instructions (Addendum)
Thank you for coming to the office today.  1.  Cr 1.34 - this is slightly better but overall about the same as last time 1.37  Continue to take Meloxicam 15mg  only TWICE a week as discussed  If you can get by with this that is preferred. However if truly need it you can take 3rd tablet of Meloxicam a week ONCE IN A WHILE - 1-2 times a month only  Increase water intake  2.  Refilled Tramadol rx today - good for 6 months, sent to pharmacy  DUE for FASTING BLOOD WORK (no food or drink after midnight before the lab appointment, only water or coffee without cream/sugar on the morning of)  SCHEDULE "Lab Only" visit in the morning at the clinic for lab draw in 6 MONTHS   - Make sure Lab Only appointment is at about 1 week before your next appointment, so that results will be available  For Lab Results, once available within 2-3 days of blood draw, you can can log in to MyChart online to view your results and a brief explanation. Also, we can discuss results at next follow-up visit.   Please schedule a Follow-up Appointment to: Return in about 6 months (around 05/22/2018) for Annual Physical.    If you have any other questions or concerns, please feel free to call the office or send a message through MyChart. You may also schedule an earlier appointment if necessary.  Additionally, you may be receiving a survey about your experience at our office within a few days to 1 week by e-mail or mail. We value your feedback.  Saralyn PilarAlexander Dell Hurtubise, DO [facility]

## 2017-11-23 ENCOUNTER — Other Ambulatory Visit: Payer: Self-pay | Admitting: Family Medicine

## 2017-11-23 DIAGNOSIS — F419 Anxiety disorder, unspecified: Secondary | ICD-10-CM

## 2017-11-23 DIAGNOSIS — E7800 Pure hypercholesterolemia, unspecified: Secondary | ICD-10-CM

## 2017-11-23 DIAGNOSIS — M150 Primary generalized (osteo)arthritis: Secondary | ICD-10-CM

## 2017-11-23 DIAGNOSIS — N183 Chronic kidney disease, stage 3 unspecified: Secondary | ICD-10-CM

## 2017-11-23 DIAGNOSIS — N138 Other obstructive and reflux uropathy: Secondary | ICD-10-CM

## 2017-11-23 DIAGNOSIS — I10 Essential (primary) hypertension: Secondary | ICD-10-CM

## 2017-11-23 DIAGNOSIS — M159 Polyosteoarthritis, unspecified: Secondary | ICD-10-CM

## 2017-11-23 DIAGNOSIS — N401 Enlarged prostate with lower urinary tract symptoms: Secondary | ICD-10-CM

## 2017-11-23 DIAGNOSIS — Z0000 Encounter for general adult medical examination without abnormal findings: Secondary | ICD-10-CM

## 2017-11-23 DIAGNOSIS — R972 Elevated prostate specific antigen [PSA]: Secondary | ICD-10-CM

## 2017-11-23 DIAGNOSIS — R7309 Other abnormal glucose: Secondary | ICD-10-CM

## 2017-11-23 NOTE — Assessment & Plan Note (Signed)
Stable chronic OA/DJD multiple joints knees, hands, feet, prior problems with shoulders. - Complicated by CKD limiting NSAID use - improved on max conservative therapy - Followed by [facility] Dr Martha ClanKrasinski, [facility] (orthotics, inj) - S/p last L knee steroid injection 06/21/17  Plan: 1. Discussion again today on managing his arthritis pain meds and affect on CKD, see A&P - Continue current plan Meloxicam 15mg  TWICE weekly q 3-4 days - advised he may add a THIRD dose rarely 1-2 x a month - Refilled Tramadol 50mg  QID - will likely need [facility] Contract at future visit - Continue Diclofenac topical gel 1-2 times daily - Continue inc dose Tylenol 2. Follow-up with Ortho vs Podiatry 3. Follow-up 6 months - adjust meds accordingly will trend Cr

## 2017-11-23 NOTE — Assessment & Plan Note (Signed)
Stable chronic problem CKD-III, likely secondary to HTN, also history of NSAID use - Cr baseline 1.2 to 1.4, recently - Last Cr 1.34 (from 1.37)   Plan: 1. Discussion today again on CKD diagnosis and management and prognosis - given that BP controlled, and now really limiting NSAIDs, suspect CKD will remain stable for long time and should not dramatically worsen - provided reassurance today - See A&P for ortho to adjust NSAID dosing, for now continue intermittent meloxicam - may slightly increase to 3 x per week if truly need in future - Continue topical diclofenac, Tylenol, Tramadol - Increase hydration with water, drink less tea, remain off diet soda 2. Follow-up Cr trend - next in 6 months with annual - decided to defer Nephrology still, reconsider if Cr persistent >1.3 or higher >1.4

## 2017-11-23 NOTE — Assessment & Plan Note (Signed)
Stable Seems tamsulosin less effective now Continue Finasteride May reduce Tamsulosin to PRN - has plenty no new rx

## 2017-12-27 ENCOUNTER — Other Ambulatory Visit: Payer: Self-pay

## 2017-12-27 DIAGNOSIS — N400 Enlarged prostate without lower urinary tract symptoms: Secondary | ICD-10-CM

## 2017-12-27 MED ORDER — FINASTERIDE 5 MG PO TABS: 5.0000 mg | ORAL_TABLET | Freq: Every day | ORAL | 0 refills | Status: DC

## 2017-12-31 ENCOUNTER — Other Ambulatory Visit: Payer: Self-pay | Admitting: Family Medicine

## 2017-12-31 DIAGNOSIS — I10 Essential (primary) hypertension: Secondary | ICD-10-CM

## 2017-12-31 MED ORDER — AMLODIPINE BESYLATE 5 MG PO TABS: 5.0000 mg | ORAL_TABLET | Freq: Every day | ORAL | 3 refills | Status: DC

## 2018-01-02 ENCOUNTER — Other Ambulatory Visit: Payer: Self-pay | Admitting: Family Medicine

## 2018-01-02 DIAGNOSIS — I10 Essential (primary) hypertension: Secondary | ICD-10-CM

## 2018-01-02 DIAGNOSIS — N400 Enlarged prostate without lower urinary tract symptoms: Secondary | ICD-10-CM

## 2018-01-02 MED ORDER — TAMSULOSIN HCL 0.4 MG PO CAPS: 0.4000 mg | ORAL_CAPSULE | Freq: Every day | ORAL | 3 refills | Status: DC

## 2018-01-02 MED ORDER — AMLODIPINE BESYLATE 5 MG PO TABS: 5.0000 mg | ORAL_TABLET | Freq: Every day | ORAL | 3 refills | Status: DC

## 2018-01-02 NOTE — Addendum Note (Signed)
Addended by: Smitty CordsKARAMALEGOS, ALEXANDER J on: 01/02/2018 12:57 PM   Modules accepted: Orders

## 2018-02-17 ENCOUNTER — Other Ambulatory Visit: Payer: Self-pay

## 2018-02-17 ENCOUNTER — Telehealth: Payer: Self-pay | Admitting: Family Medicine

## 2018-02-17 DIAGNOSIS — M159 Polyosteoarthritis, unspecified: Secondary | ICD-10-CM

## 2018-02-17 DIAGNOSIS — M150 Primary generalized (osteo)arthritis: Principal | ICD-10-CM

## 2018-02-17 DIAGNOSIS — G8929 Other chronic pain: Secondary | ICD-10-CM

## 2018-02-17 NOTE — Telephone Encounter (Signed)
Received fax request rx for [place] to send Tramadol to [facility] mail order, instead of [facility] locally. Last rx was 11/2017 - he had 6 month supply to make it to August 2019 at next apt.  I advised him that I don't often do mail order rx for 90 day supply for controlled substance, but this may be exception if his dose is stable and they are willing to fill it. He said they requested the change. For now, since he just filled one and has 2 refills left, we agree it is easier to [facility], and when he returns for follow-up in office with me 05/2018 we will send rx Tramadol to [facility] for 90 day supply, and discuss UDS and Contract signing for Controlled med  Saralyn Pilar, DO [facility] 02/17/2018, 5:02 PM

## 2018-03-08 ENCOUNTER — Other Ambulatory Visit: Payer: Self-pay | Admitting: Family Medicine

## 2018-03-08 DIAGNOSIS — N400 Enlarged prostate without lower urinary tract symptoms: Secondary | ICD-10-CM

## 2018-05-08 ENCOUNTER — Other Ambulatory Visit: Payer: Self-pay | Admitting: Family Medicine

## 2018-05-08 DIAGNOSIS — M159 Polyosteoarthritis, unspecified: Secondary | ICD-10-CM

## 2018-05-08 DIAGNOSIS — G8929 Other chronic pain: Secondary | ICD-10-CM

## 2018-05-08 DIAGNOSIS — M150 Primary generalized (osteo)arthritis: Principal | ICD-10-CM

## 2018-05-16 ENCOUNTER — Other Ambulatory Visit: Payer: Medicare HMO

## 2018-05-16 DIAGNOSIS — I10 Essential (primary) hypertension: Secondary | ICD-10-CM | POA: Diagnosis not present

## 2018-05-16 DIAGNOSIS — Z0000 Encounter for general adult medical examination without abnormal findings: Secondary | ICD-10-CM | POA: Diagnosis not present

## 2018-05-16 DIAGNOSIS — N183 Chronic kidney disease, stage 3 unspecified: Secondary | ICD-10-CM

## 2018-05-16 DIAGNOSIS — E7800 Pure hypercholesterolemia, unspecified: Secondary | ICD-10-CM | POA: Diagnosis not present

## 2018-05-16 DIAGNOSIS — R972 Elevated prostate specific antigen [PSA]: Secondary | ICD-10-CM

## 2018-05-16 DIAGNOSIS — N401 Enlarged prostate with lower urinary tract symptoms: Secondary | ICD-10-CM | POA: Diagnosis not present

## 2018-05-16 DIAGNOSIS — R7309 Other abnormal glucose: Secondary | ICD-10-CM

## 2018-05-16 DIAGNOSIS — N138 Other obstructive and reflux uropathy: Secondary | ICD-10-CM

## 2018-05-19 ENCOUNTER — Other Ambulatory Visit: Payer: Medicare HMO

## 2018-05-19 ENCOUNTER — Encounter: Payer: Self-pay | Admitting: Family Medicine

## 2018-05-19 DIAGNOSIS — R7309 Other abnormal glucose: Secondary | ICD-10-CM | POA: Insufficient documentation

## 2018-05-19 LAB — COMPLETE METABOLIC PANEL WITH GFR
AG Ratio: 1.6 (calc) (ref 1.0–2.5)
ALT: 9 U/L (ref 9–46)
AST: 13 U/L (ref 10–35)
Albumin: 4.2 g/dL (ref 3.6–5.1)
Alkaline phosphatase (APISO): 80 U/L (ref 40–115)
BILIRUBIN TOTAL: 0.5 mg/dL (ref 0.2–1.2)
BUN/Creatinine Ratio: 18 (calc) (ref 6–22)
BUN: 24 mg/dL (ref 7–25)
CALCIUM: 9.7 mg/dL (ref 8.6–10.3)
CHLORIDE: 109 mmol/L (ref 98–110)
CO2: 22 mmol/L (ref 20–32)
CREATININE: 1.32 mg/dL — AB (ref 0.70–1.18)
GFR, EST AFRICAN AMERICAN: 62 mL/min/{1.73_m2} (ref >=60)
GFR, Est Non African American: 54 mL/min/{1.73_m2} — ABNORMAL LOW (ref >=60)
Globulin: 2.7 g/dL (calc) (ref 1.9–3.7)
Glucose, Bld: 88 mg/dL (ref 65–99)
Potassium: 5 mmol/L (ref 3.5–5.3)
Sodium: 141 mmol/L (ref 135–146)
TOTAL PROTEIN: 6.9 g/dL (ref 6.1–8.1)

## 2018-05-19 LAB — CBC WITH DIFFERENTIAL/PLATELET
BASOS PCT: 0.6 %
Basophils Absolute: 37 cells/uL (ref 0–200)
EOS ABS: 242 {cells}/uL (ref 15–500)
Eosinophils Relative: 3.9 %
HCT: 42.5 % (ref 38.5–50.0)
HEMOGLOBIN: 14.4 g/dL (ref 13.2–17.1)
LYMPHS ABS: 1345 {cells}/uL (ref 850–3900)
MCH: 29.9 pg (ref 27.0–33.0)
MCHC: 33.9 g/dL (ref 32.0–36.0)
MCV: 88.4 fL (ref 80.0–100.0)
MPV: 10.3 fL (ref 7.5–12.5)
Monocytes Relative: 6.3 %
Neutro Abs: 4185 cells/uL (ref 1500–7800)
Neutrophils Relative %: 67.5 %
PLATELETS: 245 10*3/uL (ref 140–400)
RBC: 4.81 10*6/uL (ref 4.20–5.80)
RDW: 13.7 % (ref 11.0–15.0)
TOTAL LYMPHOCYTE: 21.7 %
WBC: 6.2 10*3/uL (ref 3.8–10.8)
WBCMIX: 391 {cells}/uL (ref 200–950)

## 2018-05-19 LAB — HEMOGLOBIN A1C
Hgb A1c MFr Bld: 5.7 % of total Hgb — ABNORMAL HIGH (ref <5.7)
MEAN PLASMA GLUCOSE: 117 (calc)
eAG (mmol/L): 6.5 (calc)

## 2018-05-19 LAB — LIPID PANEL
Cholesterol: 214 mg/dL — ABNORMAL HIGH (ref <200)
HDL: 68 mg/dL (ref >40)
LDL Cholesterol (Calc): 130 mg/dL (calc) — ABNORMAL HIGH
NON-HDL CHOLESTEROL (CALC): 146 mg/dL — AB (ref <130)
TRIGLYCERIDES: 65 mg/dL (ref <150)
Total CHOL/HDL Ratio: 3.1 (calc) (ref <5.0)

## 2018-05-19 LAB — PSA, TOTAL WITH REFLEX TO PSA, FREE: PSA, Total: 0.8 ng/mL (ref <=4.0)

## 2018-05-20 ENCOUNTER — Telehealth: Payer: Self-pay | Admitting: Family Medicine

## 2018-05-20 DIAGNOSIS — M150 Primary generalized (osteo)arthritis: Principal | ICD-10-CM

## 2018-05-20 DIAGNOSIS — G8929 Other chronic pain: Secondary | ICD-10-CM

## 2018-05-20 DIAGNOSIS — M159 Polyosteoarthritis, unspecified: Secondary | ICD-10-CM

## 2018-05-20 MED ORDER — TRAMADOL HCL 50 MG PO TABS: 50.0000 mg | ORAL_TABLET | Freq: Four times a day (QID) | ORAL | 2 refills | Status: DC

## 2018-05-20 NOTE — Telephone Encounter (Signed)
[facility] was [facility]. [facility] I checked [place] CSRS PMP AWARE. [facility]  Refilled Tramadol 50mg  QID - #120 pills +2 refills for 30 day supply with refills up to 3 months.  Will change to 90 day supply if covered or [facility].  Patient visit on 05/23/18  Saralyn PilarAlexander Edison Nicholson, DO [facility] 05/20/2018, 4:52 PM

## 2018-05-23 ENCOUNTER — Encounter: Payer: Medicare HMO | Admitting: Family Medicine

## 2018-05-26 ENCOUNTER — Encounter: Payer: Self-pay | Admitting: Family Medicine

## 2018-05-26 ENCOUNTER — Other Ambulatory Visit: Payer: Self-pay | Admitting: Family Medicine

## 2018-05-26 ENCOUNTER — Ambulatory Visit (INDEPENDENT_AMBULATORY_CARE_PROVIDER_SITE_OTHER): Payer: Medicare HMO | Admitting: Family Medicine

## 2018-05-26 VITALS — BP 115/54 | HR 78 | Temp 98.2°F | Resp 16 | Ht 70.0 in | Wt 164.0 lb

## 2018-05-26 DIAGNOSIS — N401 Enlarged prostate with lower urinary tract symptoms: Secondary | ICD-10-CM

## 2018-05-26 DIAGNOSIS — N183 Chronic kidney disease, stage 3 unspecified: Secondary | ICD-10-CM

## 2018-05-26 DIAGNOSIS — G8929 Other chronic pain: Secondary | ICD-10-CM | POA: Diagnosis not present

## 2018-05-26 DIAGNOSIS — I10 Essential (primary) hypertension: Secondary | ICD-10-CM | POA: Diagnosis not present

## 2018-05-26 DIAGNOSIS — E7800 Pure hypercholesterolemia, unspecified: Secondary | ICD-10-CM | POA: Diagnosis not present

## 2018-05-26 DIAGNOSIS — M150 Primary generalized (osteo)arthritis: Secondary | ICD-10-CM | POA: Diagnosis not present

## 2018-05-26 DIAGNOSIS — Z0000 Encounter for general adult medical examination without abnormal findings: Secondary | ICD-10-CM | POA: Diagnosis not present

## 2018-05-26 DIAGNOSIS — R7309 Other abnormal glucose: Secondary | ICD-10-CM

## 2018-05-26 DIAGNOSIS — N138 Other obstructive and reflux uropathy: Secondary | ICD-10-CM | POA: Diagnosis not present

## 2018-05-26 DIAGNOSIS — M159 Polyosteoarthritis, unspecified: Secondary | ICD-10-CM

## 2018-05-26 MED ORDER — TRAMADOL HCL 50 MG PO TABS
50.0000 mg | ORAL_TABLET | Freq: Four times a day (QID) | ORAL | 1 refills | Status: DC
Start: 2018-05-26 — End: 2018-09-30

## 2018-05-26 MED ORDER — LOVASTATIN 20 MG PO TABS: 20.0000 mg | ORAL_TABLET | Freq: Every day | ORAL | 3 refills | Status: DC

## 2018-05-26 NOTE — Assessment & Plan Note (Signed)
Stable chronic OA/DJD multiple joints knees, hands, feet, prior problems with shoulders. - Complicated by CKD limiting NSAID use - improved on max conservative therapy - Followed by [facility] Dr Martha ClanKrasinski, [facility] (orthotics, inj) - S/p last L knee steroid injection 06/21/17  Plan: 1. Discussion again today on managing his arthritis pain meds and affect on CKD, see A&P - Continue current plan Meloxicam 15mg  TWICE weekly q 3-4 days - still agree that if needed he may add a THIRD dose rarely 1-2 x a month - Refilled Tramadol 50mg  QID - changed rx for #360 pills for 3 month supply per bottle with +1 refill for total 6 months, [facility]. - Continue Diclofenac topical gel 1-2 times daily - Continue inc dose Tylenol Follow-up 6 months - adjust meds accordingly will trend Cr

## 2018-05-26 NOTE — Assessment & Plan Note (Signed)
Chronic BPH, has some worsening symptoms at times with difficulty voiding but overall still doing well Continues on finasteride and tamsulosin  Referral to [facility] Dr Anola GurneyMichael Wolff by request to consider UroLift procedure and discuss treatment options - he will review all of his surgical questions with Urology regarding this procedure.

## 2018-05-26 NOTE — Progress Notes (Signed)
Subjective:    Patient ID: Jerry Simmons, male    DOB: [birth year], 72 y.o.   MRN: [id number]  Jerry BurnGerald Burkhead is a 72 y.o. male presenting on 05/26/2018 for Annual Exam   HPI   Here for Annual Physical and Lab Review.  Elevated A1c - No prior problem. Recently elevated A1c from 5.4 up to 5.7 - Not taking medicine - Lifestyle: - Weight down 5 lbs in 6 months - Diet: Admits eating more ice cream and some snacks, drinks water, rarely drinking tea, quit sodas since 09/2017 - Exercise: active, walking  Osteoarthritis, Multiple joints (knees, hands, feet)/ Chronic Knee Pain Last visit with me 11/2017, for same problemalso followed by Dr Martha ClanKrasinski ([facility]), has had knee injections, among other therapy including Tylenol, Tramadol, Meloxicam and topical Diclofenac,see prior notes for background information. - Interval update - we agreed upon intermittent dosing on NSAID x 2 weekly - Today patient reports overall no significant changes. He thinks arthritis is overall stable and controlled. Does not seem to be significantly worsening. Admits winter makes knees ache more, he wears braces - Currently taking Meloxicam 15mg  TWICE a week (M-F) - max up to 3 times. - Taking Tramadol 50mg  QID, Diclofenac gel topical 1-2x times most days (hands, feet, knees), Tylenol Ext Str 500mg  2-3 times daily - He is able to maintain daily function -Also taking Glucosamine 1500mg  and Chondroitin 1200mg  twice daily - Admits some numbness and tingling in hands, admits triggered by push lawn mower   FOLLOW-UPCKD-III - Last visit 11/2017, CKD, labs reviewed. - Today reviewed last lab Cr 1.32, stable from prior - No new concerns with kidney function, at this time. Voiding well. - He still takes meloxicam intermittently see above - He is already taking Lisinopril 40mg  daily for HTN and renal protection - No longer drinking diet pepsi soda, he does drink tea regularly and less water (especially less water  in winter) - Never seen Nephrology  BPH - Chronic problem. Previously followed by [facility] Dr Anola GurneyMichael Wolff, he has not returned for a while, he takes medications Finasteride 5mg  daily and Tamsulosin 0.4mg  daily with good results, only admits worst problem is if he holds urine too long, can have difficulty voiding at that time or starting stream in AM usually - but never had acute retention causing other problem in past. - Today he is asking about UroLift procedure, he was told this is minimally invasive and can be an option for him he would like to discuss with Dr Evelene CroonWolff  HYPERLIPIDEMIA: - Reports no concerns. Last lipid panel 05/2018, mostly well controlled with good HDL, mild elevated LDL still - Currently taking Lovastatin 20mg  (5 x weekly now), tolerating well without side effects or myalgias - Not taking ASA  Health Maintenance: - UTD PNA vaccines.  - UTD Shingrix 1st dose 1 week ago, next dose in October 2019  - Due for routine Hepatitis C screening, will get next year  - Due Flu vaccine, will return in October 2019  - Last colonoscopy 2009, was told 10 years, next due 1 year in 2019, he will be age [age], recommended repeat screening he will consider this again and return or request colonoscopy if ready  Dep[facility] screen [facility] 2[facility] 05/26/2018 11/22/2017 12/03/2016  Decreased Interest 0 0 0  Down, Depressed, Hopeless 0 0 0  PHQ - 2 Score 0 0 0    Past Medical History:  Diagnosis Date  . Arthritis    History reviewed. No pertinent surgical history.  Social History   Socioeconomic History  . Marital status: Married    Spouse name: Not on file  . Number of children: Not on file  . Years of education: Not on file  . Highest education level: Not on file  Occupational History  . Not on file  Social Needs  . Financial resource strain: Not on file  . Food insecurity:    Worry: Not on file    Inability: Not on file  . Transportation needs:    Medical: Not on file     Non-medical: Not on file  Tobacco Use  . Smoking status: Former Smoker    Packs/day: 3.00    Years: 20.00    Pack years: 60.00    Types: Cigarettes    Last attempt to quit: 09/16/1985    Years since quitting: 32.7  . Smokeless tobacco: Former Engineer, waterUser  Substance and Sexual Activity  . Alcohol use: Yes    Alcohol/week: 1.0 standard drinks    Types: 1 Glasses of wine per week    Comment: Only has wine on occassional.   . Drug use: No  . Sexual activity: Not on file  Lifestyle  . Physical activity:    Days per week: Not on file    Minutes per session: Not on file  . Stress: Not on file  Relationships  . Social connections:    Talks on phone: Not on file    Gets together: Not on file    Attends religious service: Not on file    Active member of club or organization: Not on file    Attends meetings of clubs or organizations: Not on file    Relationship status: Not on file  . Intimate partner violence:    Fear of current or ex partner: Not on file    Emotionally abused: Not on file    Physically abused: Not on file    Forced sexual activity: Not on file  Other Topics Concern  . Not on file  Social History Narrative  . Not on file   History reviewed. No pertinent family history. Current Outpatient Medications on File Prior to Visit  Medication Sig  . amLODipine (NORVASC) 5 MG tablet Take 1 tablet (5 mg total) by mouth daily.  . diclofenac sodium (VOLTAREN) 1 % GEL Apply 2 g topically 3 (three) times daily as needed. For knees and hands, arthritis  . finasteride (PROSCAR) 5 MG tablet TAKE 1 TABLET EVERY DAY  . Glucosamine-Chondroit-Vit C-Mn (GLUCOSAMINE 1500 COMPLEX PO) Take 1,500 mg 2 (two) times daily by mouth.  Marland Kitchen. lisinopril (PRINIVIL,ZESTRIL) 40 MG tablet Take 1 tablet (40 mg total) by mouth daily.  . meloxicam (MOBIC) 15 MG tablet Take 1 tablet (15 mg total) by mouth 2 (two) times a week.  . tamsulosin (FLOMAX) 0.4 MG CAPS capsule Take 1 capsule (0.4 mg total) by mouth daily.     No current facility-administered medications on file prior to visit.     Review of Systems  Constitutional: Negative for activity change, appetite change, chills, diaphoresis, fatigue and fever.  HENT: Negative for congestion, hearing loss and sinus pressure.   Eyes: Negative for visual disturbance.  Respiratory: Negative for apnea, cough, choking, chest tightness, shortness of breath and wheezing.   Cardiovascular: Negative for chest pain, palpitations and leg swelling.  Gastrointestinal: Negative for abdominal pain, anal bleeding, blood in stool, constipation, diarrhea, nausea and vomiting.  Endocrine: Negative for cold intolerance.  Genitourinary: Negative for difficulty urinating, dysuria, frequency and hematuria.  Musculoskeletal: Positive for arthralgias. Negative for back pain and neck pain.  Skin: Negative for rash.  Allergic/Immunologic: Negative for environmental allergies.  Neurological: Negative for dizziness, weakness, light-headedness and headaches.       Episodes of tingling and numb in left hand fingers  Hematological: Negative for adenopathy.  Psychiatric/Behavioral: Negative for behavioral problems, dysphoric mood and sleep disturbance. The patient is not nervous/anxious.    Per HPI unless specifically indicated above     Objective:    BP (!) 115/54   Pulse 78   Temp 98.2 F (36.8 C) (Oral)   Resp 16   Ht 5\' 10"  (1.778 m)   Wt 164 lb (74.4 kg)   BMI 23.53 kg/m   Wt Readings from Last 3 Encounters:  05/26/18 164 lb (74.4 kg)  11/22/17 169 lb (76.7 kg)  08/26/17 167 lb (75.8 kg)    Physical Exam  Constitutional: He is oriented to person, place, and time. He appears well-developed and well-nourished. No distress.  Well-appearing, comfortable, cooperative  HENT:  Head: Normocephalic and atraumatic.  Mouth/Throat: Oropharynx is clear and moist.  Frontal / maxillary sinuses non-tender. Nares patent without purulence or edema.  L TM obscured by dry  cerumen impacted R TM normal  Oropharynx clear without erythema, exudates, edema or asymmetry.  Eyes: Pupils are equal, round, and reactive to light. Conjunctivae and EOM are normal. Right eye exhibits no discharge. Left eye exhibits no discharge.  Neck: Normal range of motion. Neck supple. No thyromegaly present.  Cardiovascular: Normal rate, regular rhythm, normal heart sounds and intact distal pulses.  No murmur heard. Pulmonary/Chest: Effort normal and breath sounds normal. No respiratory distress. He has no wheezes. He has no rales.  Abdominal: Soft. Bowel sounds are normal. He exhibits no distension and no mass. There is no tenderness.  Musculoskeletal: Normal range of motion. He exhibits no edema or tenderness.  Upper / Lower Extremities: - Normal muscle tone, strength bilateral upper extremities 5/5, lower extremities 5/5  Bilateral Hands Inspection: Bulky MCP 1st and other joints including DIP mostly symmetrical, without erythema or effusion Palpation: Non tender hand / wrist, carpal bones, including MCP, base of thumb. No distinct anatomical snuff box or scaphoid tenderness.  ROM: full active wrist ROM flex / ext, ulnar / radial deviation Special Testing: Did not provoke symptoms with carpal tunnel testing today Strength: 5/5 grip, thumb opposition, wrist flex/ext Neurovascular: distally intact   Lymphadenopathy:    He has no cervical adenopathy.  Neurological: He is alert and oriented to person, place, and time.  Distal sensation intact to light touch all extremities  Skin: Skin is warm and dry. No rash noted. He is not diaphoretic. No erythema.  Psychiatric: He has a normal mood and affect. His behavior is normal.  Well groomed, good eye contact, normal speech and thoughts  Nursing note and vitals reviewed.  Results for orders placed or performed in visit on 05/16/18  PSA, Total with Reflex to PSA, Free  Result Value Ref Range   PSA, Total 0.8 < OR = 4.0 ng/mL   Hemoglobin A1c  Result Value Ref Range   Hgb A1c MFr Bld 5.7 (H) <5.7 % of total Hgb   Mean Plasma Glucose 117 (calc)   eAG (mmol/L) 6.5 (calc)  CBC with Differential/Platelet  Result Value Ref Range   WBC 6.2 3.8 - 10.8 Thousand/uL   RBC 4.81 4.20 - 5.80 Million/uL   Hemoglobin 14.4 13.2 - 17.1 g/dL   HCT 40.9 81.1 - 91.4 %  MCV 88.4 80.0 - 100.0 fL   MCH 29.9 27.0 - 33.0 pg   MCHC 33.9 32.0 - 36.0 g/dL   RDW 09.8 11.9 - 14.7 %   Platelets 245 140 - 400 Thousand/uL   MPV 10.3 7.5 - 12.5 fL   Neutro Abs 4,185 1,500 - 7,800 cells/uL   Lymphs Abs 1,345 850 - 3,900 cells/uL   WBC mixed population 391 200 - 950 cells/uL   Eosinophils Absolute 242 15 - 500 cells/uL   Basophils Absolute 37 0 - 200 cells/uL   Neutrophils Relative % 67.5 %   Total Lymphocyte 21.7 %   Monocytes Relative 6.3 %   Eosinophils Relative 3.9 %   Basophils Relative 0.6 %  Lipid panel  Result Value Ref Range   Cholesterol 214 (H) <200 mg/dL   HDL 68 >82 mg/dL   Triglycerides 65 <956 mg/dL   LDL Cholesterol (Calc) 130 (H) mg/dL (calc)   Total CHOL/HDL Ratio 3.1 <5.0 (calc)   Non-HDL Cholesterol (Calc) 146 (H) <130 mg/dL (calc)  COMPLETE METABOLIC PANEL WITH GFR  Result Value Ref Range   Glucose, Bld 88 65 - 99 mg/dL   BUN 24 7 - 25 mg/dL   Creat 2.13 (H) 0.86 - 1.18 mg/dL   GFR, Est Non African American 54 (L) > OR = 60 mL/min/1.7m2   GFR, Est African American 62 > OR = 60 mL/min/1.72m2   BUN/Creatinine Ratio 18 6 - 22 (calc)   Sodium 141 135 - 146 mmol/L   Potassium 5.0 3.5 - 5.3 mmol/L   Chloride 109 98 - 110 mmol/L   CO2 22 20 - 32 mmol/L   Calcium 9.7 8.6 - 10.3 mg/dL   Total Protein 6.9 6.1 - 8.1 g/dL   Albumin 4.2 3.6 - 5.1 g/dL   Globulin 2.7 1.9 - 3.7 g/dL (calc)   AG Ratio 1.6 1.0 - 2.5 (calc)   Total Bilirubin 0.5 0.2 - 1.2 mg/dL   Alkaline phosphatase (APISO) 80 40 - 115 U/L   AST 13 10 - 35 U/L   ALT 9 9 - 46 U/L      Assessment & Plan:   Problem List Items Addressed This  Visit    BPH with obstruction/lower urinary tract symptoms    Chronic BPH, has some worsening symptoms at times with difficulty voiding but overall still doing well Continues on finasteride and tamsulosin  Referral to [facility] Dr Anola Gurney by request to consider UroLift procedure and discuss treatment options - he will review all of his surgical questions with Urology regarding this procedure.       Relevant Orders   Ambulatory referral to Urology   Chronic pain    Secondary to OA/DJD See A&P      Relevant Medications   traMADol (ULTRAM) 50 MG tablet   CKD (chronic kidney disease), stage III (HCC)    Stable chronic problem CKD-III, likely secondary to HTN, also history of NSAID use - Cr baseline 1.2 to 1.4 - Last Cr 1.32, unchanged   Plan: 1. Discussion today again on CKD diagnosis and management and prognosis - See A&P for ortho to adjust NSAID dosing, for now continue intermittent meloxicam - may slightly increase to 3 x per week if truly need in future - Continue topical diclofenac, Tylenol, Tramadol - Keep improved hydration with water, drink less tea, remain off diet soda 2. Follow-up Cr trend q 6 months - will defer Nephrology for future if need      Elevated hemoglobin  A1c    Mild elevated A1c to 5.7, new reading, no prior elevated  Plan:  1. Not on any therapy currently  2. Encourage improved lifestyle - low carb, low sugar diet, reduce portion size, continue improving regular exercise - diet handout given on glycemic diet 3. Follow-up 6 months repeat A1c       Essential (primary) hypertension    Well controlled HTN - Home BP readings normal range  Complication with CKD-III    Plan:  1. Continue current BP regimen Amlodipine 5mg  daily, Lisinopril 40mg  daily 2. Encourage improved lifestyle - low sodium diet, regular exercise 3. Continue monitor BP outside office, bring readings to next visit, if persistently >140/90 or new symptoms notify office  sooner 4. Follow-up 6 months - repeat Cr trend chemistry q 6 mo      Relevant Medications   lovastatin (MEVACOR) 20 MG tablet   Hypercholesteremia    Mostly controlled cholesterol on statin and lifestyle Last lipid panel 05/2018 Calculated ASCVD 10 yr risk score >15%  Plan: 1. INCREASE to Lovastatin 20mg  daily now - he was up to 5 x weekly - tolerating 2. Counseling on ASCVD risk reduction - may take ASA 81 3. Encourage improved lifestyle - low carb/cholesterol, reduce portion size, continue improving regular exercise 4. Follow-up lipids 1 year - future reconsider other statins and options      Relevant Medications   lovastatin (MEVACOR) 20 MG tablet   Osteoarthritis of multiple joints    Stable chronic OA/DJD multiple joints knees, hands, feet, prior problems with shoulders. - Complicated by CKD limiting NSAID use - improved on max conservative therapy - Followed by [facility] Dr [name], referred to [facility] (orthotics, inj) - S/p last L knee steroid injection 06/21/17  Plan: 1. Discussion again today on managing his arthritis pain meds and affect on CKD, see A&P - Continue current plan Meloxicam 15mg  TWICE weekly q 3-4 days - still agree that if needed he may add a THIRD dose rarely 1-2 x a month - Refilled Tramadol 50mg  QID - changed rx for #360 pills for 3 month supply per bottle with +1 refill for total 6 months, sent mail order [facility]. - Continue Diclofenac topical gel 1-2 times daily - Continue inc dose Tylenol Follow-up 6 months - adjust meds accordingly will trend Cr      Relevant Medications   traMADol (ULTRAM) 50 MG tablet    Other Visit Diagnoses    Annual physical exam    -  Primary      Meds ordered this encounter  Medications  . lovastatin (MEVACOR) 20 MG tablet    Sig: Take 1 tablet (20 mg total) by mouth daily.    Dispense:  90 tablet    Refill:  3    Updated dosing instructions, from every other to daily  . traMADol (ULTRAM) 50 MG tablet     Sig: Take 1 tablet (50 mg total) by mouth 4 (four) times daily.    Dispense:  360 tablet    Refill:  1    Changing to a 3 month supply per fill, please only fill this rx when ready for next refill   Referral back to [facility] Dr Anola Gurney (previous Urologist) now patient returning for 2nd opinion and considering UroLift procedure for BPH, already on medication  Orders Placed This Encounter  Procedures  . Ambulatory referral to Urology    Referral Priority:   Routine    Referral Type:   Consultation  Referral Reason:   Specialty Services Required    Referred to Provider:   Orson Ape, MD    Requested Specialty:   Urology    Number of Visits Requested:   1    Follow up plan: Return in about 6 months (around 11/26/2018) for CKD, A1c, Chronic Pain / OA/DJD refill.   Future labs ordered for 11/24/18 - A1c, BMET  Saralyn Pilar, DO [facility] 05/26/2018, 5:51 PM

## 2018-05-26 NOTE — Assessment & Plan Note (Signed)
Well controlled HTN - Home BP readings normal range  Complication with CKD-III    Plan:  1. Continue current BP regimen Amlodipine 5mg  daily, Lisinopril 40mg  daily 2. Encourage improved lifestyle - low sodium diet, regular exercise 3. Continue monitor BP outside office, bring readings to next visit, if persistently >140/90 or new symptoms notify office sooner 4. Follow-up 6 months - repeat Cr trend chemistry q 6 mo

## 2018-05-26 NOTE — Assessment & Plan Note (Signed)
Stable chronic problem CKD-III, likely secondary to HTN, also history of NSAID use - Cr baseline 1.2 to 1.4 - Last Cr 1.32, unchanged   Plan: 1. Discussion today again on CKD diagnosis and management and prognosis - See A&P for ortho to adjust NSAID dosing, for now continue intermittent meloxicam - may slightly increase to 3 x per week if truly need in future - Continue topical diclofenac, Tylenol, Tramadol - Keep improved hydration with water, drink less tea, remain off diet soda 2. Follow-up Cr trend q 6 months - will defer Nephrology for future if need

## 2018-05-26 NOTE — Assessment & Plan Note (Signed)
Mostly controlled cholesterol on statin and lifestyle Last lipid panel 05/2018 Calculated ASCVD 10 yr risk score >15%  Plan: 1. INCREASE to Lovastatin 20mg  daily now - he was up to 5 x weekly - tolerating 2. Counseling on ASCVD risk reduction - may take ASA 81 3. Encourage improved lifestyle - low carb/cholesterol, reduce portion size, continue improving regular exercise 4. Follow-up lipids 1 year - future reconsider other statins and options

## 2018-05-26 NOTE — Patient Instructions (Addendum)
Thank you for coming to the office today.  A1c slightly elevated 5.7, try to improve diet as discussed -recheck in 6 months  Referral sent to Dr Evelene CroonWolff [facility] - they should call you - if you do not hear back on apt - then call their office in 2 weeks  Return for High Dose Flu Vaccine in October 2019 - call to check status, and request NURSE VISIT for Flu Shot  [facility]  Refilled Tramadol 50mg  4 times daily  #360 pills + 1 refill for total 3 month per bottle with additional 3 month in a refill - Please call [facility] pharmacy to confirm that they receive this new change and see if any other concerns with the rx - make sure old rx #120 pills is cancelled  DUE for FASTING BLOOD WORK (no food or drink after midnight before the lab appointment, only water or coffee without cream/sugar on the morning of)  SCHEDULE "Lab Only" visit in the morning at the clinic for lab draw in 6 MONTHS   - Make sure Lab Only appointment is at about 1 week before your next appointment, so that results will be available  For Lab Results, once available within 2-3 days of blood draw, you can can log in to MyChart online to view your results and a brief explanation. Also, we can discuss results at next follow-up visit.   Please schedule a Follow-up Appointment to: Return in about 6 months (around 11/26/2018) for CKD, A1c, Chronic Pain / OA/DJD refill.  If you have any other questions or concerns, please feel free to call the office or send a message through MyChart. You may also schedule an earlier appointment if necessary.  Additionally, you may be receiving a survey about your experience at our office within a few days to 1 week by e-mail or mail. We value your feedback.  Jerry [name], DO [facility]

## 2018-05-26 NOTE — Assessment & Plan Note (Signed)
Mild elevated A1c to 5.7, new reading, no prior elevated  Plan:  1. Not on any therapy currently  2. Encourage improved lifestyle - low carb, low sugar diet, reduce portion size, continue improving regular exercise - diet handout given on glycemic diet 3. Follow-up 6 months repeat A1c

## 2018-05-26 NOTE — Assessment & Plan Note (Signed)
Secondary to OA/DJD See A&P 

## 2018-06-12 ENCOUNTER — Other Ambulatory Visit: Payer: Self-pay | Admitting: Family Medicine

## 2018-06-12 DIAGNOSIS — M159 Polyosteoarthritis, unspecified: Secondary | ICD-10-CM

## 2018-06-12 DIAGNOSIS — M150 Primary generalized (osteo)arthritis: Principal | ICD-10-CM

## 2018-07-11 ENCOUNTER — Ambulatory Visit (INDEPENDENT_AMBULATORY_CARE_PROVIDER_SITE_OTHER): Payer: Medicare HMO

## 2018-07-11 VITALS — Temp 97.9°F

## 2018-07-11 DIAGNOSIS — Z23 Encounter for immunization: Secondary | ICD-10-CM

## 2018-09-15 ENCOUNTER — Other Ambulatory Visit: Payer: Self-pay | Admitting: Family Medicine

## 2018-09-15 DIAGNOSIS — N400 Enlarged prostate without lower urinary tract symptoms: Secondary | ICD-10-CM

## 2018-09-30 ENCOUNTER — Other Ambulatory Visit: Payer: Self-pay | Admitting: Family Medicine

## 2018-09-30 DIAGNOSIS — G8929 Other chronic pain: Secondary | ICD-10-CM

## 2018-09-30 DIAGNOSIS — M159 Polyosteoarthritis, unspecified: Secondary | ICD-10-CM

## 2018-09-30 DIAGNOSIS — M150 Primary generalized (osteo)arthritis: Principal | ICD-10-CM

## 2018-10-28 DIAGNOSIS — H27121 Anterior dislocation of lens, right eye: Secondary | ICD-10-CM | POA: Diagnosis not present

## 2018-11-02 ENCOUNTER — Other Ambulatory Visit: Payer: Self-pay | Admitting: Family Medicine

## 2018-11-02 DIAGNOSIS — I10 Essential (primary) hypertension: Secondary | ICD-10-CM

## 2018-11-11 DIAGNOSIS — T8522XA Displacement of intraocular lens, initial encounter: Secondary | ICD-10-CM | POA: Diagnosis not present

## 2018-11-24 ENCOUNTER — Other Ambulatory Visit: Payer: Medicare HMO

## 2018-11-27 DIAGNOSIS — H27131 Posterior dislocation of lens, right eye: Secondary | ICD-10-CM | POA: Diagnosis not present

## 2018-11-28 ENCOUNTER — Ambulatory Visit: Payer: Medicare HMO | Admitting: Family Medicine

## 2018-12-05 ENCOUNTER — Other Ambulatory Visit: Payer: Self-pay

## 2018-12-05 DIAGNOSIS — N183 Chronic kidney disease, stage 3 unspecified: Secondary | ICD-10-CM

## 2018-12-05 DIAGNOSIS — I10 Essential (primary) hypertension: Secondary | ICD-10-CM

## 2018-12-05 DIAGNOSIS — R7309 Other abnormal glucose: Secondary | ICD-10-CM

## 2018-12-08 ENCOUNTER — Other Ambulatory Visit: Payer: Medicare HMO

## 2018-12-08 DIAGNOSIS — N183 Chronic kidney disease, stage 3 (moderate): Secondary | ICD-10-CM | POA: Diagnosis not present

## 2018-12-08 DIAGNOSIS — R7309 Other abnormal glucose: Secondary | ICD-10-CM | POA: Diagnosis not present

## 2018-12-08 DIAGNOSIS — I10 Essential (primary) hypertension: Secondary | ICD-10-CM | POA: Diagnosis not present

## 2018-12-09 LAB — BASIC METABOLIC PANEL WITH GFR
BUN: 22 mg/dL (ref 7–25)
CO2: 25 mmol/L (ref 20–32)
Calcium: 9.3 mg/dL (ref 8.6–10.3)
Chloride: 106 mmol/L (ref 98–110)
Creat: 1.15 mg/dL (ref 0.70–1.18)
GFR, EST AFRICAN AMERICAN: 74 mL/min/{1.73_m2} (ref >=60)
GFR, EST NON AFRICAN AMERICAN: 64 mL/min/{1.73_m2} (ref >=60)
Glucose, Bld: 90 mg/dL (ref 65–99)
Potassium: 4.7 mmol/L (ref 3.5–5.3)
Sodium: 139 mmol/L (ref 135–146)

## 2018-12-09 LAB — HEMOGLOBIN A1C
Hgb A1c MFr Bld: 5.6 % of total Hgb (ref <5.7)
Mean Plasma Glucose: 114 (calc)
eAG (mmol/L): 6.3 (calc)

## 2018-12-10 DIAGNOSIS — H5989 Other postprocedural complications and disorders of eye and adnexa, not elsewhere classified: Secondary | ICD-10-CM | POA: Diagnosis not present

## 2018-12-10 DIAGNOSIS — T8522XA Displacement of intraocular lens, initial encounter: Secondary | ICD-10-CM | POA: Diagnosis not present

## 2018-12-10 DIAGNOSIS — H43819 Vitreous degeneration, unspecified eye: Secondary | ICD-10-CM | POA: Diagnosis not present

## 2018-12-10 DIAGNOSIS — Z9842 Cataract extraction status, left eye: Secondary | ICD-10-CM | POA: Diagnosis not present

## 2018-12-10 DIAGNOSIS — T859XXA Unspecified complication of internal prosthetic device, implant and graft, initial encounter: Secondary | ICD-10-CM | POA: Diagnosis not present

## 2018-12-10 DIAGNOSIS — Z9841 Cataract extraction status, right eye: Secondary | ICD-10-CM | POA: Diagnosis not present

## 2018-12-10 HISTORY — PX: EYE SURGERY: SHX253

## 2018-12-12 ENCOUNTER — Encounter: Payer: Self-pay | Admitting: Family Medicine

## 2018-12-12 ENCOUNTER — Ambulatory Visit (INDEPENDENT_AMBULATORY_CARE_PROVIDER_SITE_OTHER): Payer: Medicare HMO | Admitting: Family Medicine

## 2018-12-12 ENCOUNTER — Other Ambulatory Visit: Payer: Self-pay

## 2018-12-12 VITALS — BP 139/79 | HR 61 | Temp 98.0°F | Resp 16 | Ht 70.0 in | Wt 158.0 lb

## 2018-12-12 DIAGNOSIS — G8929 Other chronic pain: Secondary | ICD-10-CM | POA: Diagnosis not present

## 2018-12-12 DIAGNOSIS — I10 Essential (primary) hypertension: Secondary | ICD-10-CM | POA: Diagnosis not present

## 2018-12-12 DIAGNOSIS — R7309 Other abnormal glucose: Secondary | ICD-10-CM | POA: Diagnosis not present

## 2018-12-12 DIAGNOSIS — N182 Chronic kidney disease, stage 2 (mild): Secondary | ICD-10-CM | POA: Diagnosis not present

## 2018-12-12 DIAGNOSIS — M159 Polyosteoarthritis, unspecified: Secondary | ICD-10-CM

## 2018-12-12 DIAGNOSIS — M150 Primary generalized (osteo)arthritis: Secondary | ICD-10-CM | POA: Diagnosis not present

## 2018-12-12 MED ORDER — TRAMADOL HCL 50 MG PO TABS: 50.0000 mg | ORAL_TABLET | Freq: Four times a day (QID) | ORAL | 2 refills | Status: DC

## 2018-12-12 NOTE — Progress Notes (Signed)
Subjective:    Patient ID: Jerry Simmons, male    DOB: 07-14-1946, 73 y.o.   MRN: [id number]  Jerry Simmons is a 73 y.o. male presenting on 12/12/2018 for Hypertension   HPI   Elevated A1c Prior elevated up to A1c 5.7. he has improved lifestyle, now lab shows improved A1c 5.6 - Not taking medicine - Lifestyle: - Weight down another 6 lbs in 6 months - Diet: He stopped ice cream every night, now once a week, and improved diet reduce sugar and drinks mostly water - Exercise: active, walking  Osteoarthritis, Multiple joints (knees, hands, feet)/ Chronic Knee Pain He is currently stable without significant worsening or changes in his joint pain arthritis. - Taking Meloxicam 15mg  x 2 a week only, with some relief - Still taking Tramadol 50mg  QID regularly with good results, request refill Diclofenac gel topical 1-2x times most days (hands, feet, knees), Tylenol Ext Str 500mg  2-3 times daily - Previousyl had seen [facility] Dr [name] - He is able to maintain daily function -Alsotaking Glucosamine 1500mg  and Chondroitin 1200mg  twice daily  CHRONIC HTN / CKD-III Reports his BP has been controlled, now slightly elevated today and past few days post op eye surgery Last lab showed Improved Creatinine down from 1.3 down to 1.15, improved kidney function - less sodas now and improved water. Current Meds - Amlodipine 5mg  daily, Lisinopril 40mg  daily   Reports good compliance, took meds today. Tolerating well, w/o complaints. Denies CP, dyspnea, HA, edema, dizziness / lightheadedness  Prior cataract surgery RIGHT eye swollen, post op now recent update.   Depression screen [facility] 2/9 12/12/2018 05/26/2018 11/22/2017  Decreased Interest 0 0 0  Down, Depressed, Hopeless 0 0 0  PHQ - 2 Score 0 0 0    Social History   Tobacco Use  . Smoking status: Former Smoker    Packs/day: 3.00    Years: 20.00    Pack years: 60.00    Types: Cigarettes    Last attempt to quit: 09/16/1985   Years since quitting: 33.2  . Smokeless tobacco: Former Engineer, water Use Topics  . Alcohol use: Yes    Alcohol/week: 1.0 standard drinks    Types: 1 Glasses of wine per week    Comment: Only has wine on occassional.   . Drug use: No    Review of Systems Per HPI unless specifically indicated above     Objective:    BP 139/79   Pulse 61   Temp 98 F (36.7 C) (Oral)   Resp 16   Ht 5\' 10"  (1.778 m)   Wt 158 lb (71.7 kg)   BMI 22.67 kg/m   Wt Readings from Last 3 Encounters:  12/12/18 158 lb (71.7 kg)  05/26/18 164 lb (74.4 kg)  11/22/17 169 lb (76.7 kg)    Physical Exam Vitals signs and nursing note reviewed.  Constitutional:      General: He is not in acute distress.    Appearance: He is well-developed. He is not diaphoretic.     Comments: Well-appearing, comfortable, cooperative  HENT:     Head: Normocephalic and atraumatic.  Eyes:     General:        Right eye: No discharge.        Left eye: No discharge.     Comments: Right eye appears conjunctival injection and red and some localized soft tissue edema  Neck:     Musculoskeletal: Normal range of motion and neck supple.  Thyroid: No thyromegaly.  Cardiovascular:     Rate and Rhythm: Normal rate and regular rhythm.     Heart sounds: Normal heart sounds. No murmur.  Pulmonary:     Effort: Pulmonary effort is normal. No respiratory distress.     Breath sounds: Normal breath sounds. No wheezing or rales.  Musculoskeletal: Normal range of motion.  Lymphadenopathy:     Cervical: No cervical adenopathy.  Skin:    General: Skin is warm and dry.     Findings: No erythema or rash.  Neurological:     Mental Status: He is alert and oriented to person, place, and time.  Psychiatric:        Behavior: Behavior normal.     Comments: Well groomed, good eye contact, normal speech and thoughts    Results for orders placed or performed in visit on 12/05/18  BASIC METABOLIC PANEL WITH GFR  Result Value Ref Range    Glucose, Bld 90 65 - 99 mg/dL   BUN 22 7 - 25 mg/dL   Creat 1.22 4.82 - 5.00 mg/dL   GFR, Est Non African American 64 > OR = 60 mL/min/1.63m2   GFR, Est African American 74 > OR = 60 mL/min/1.33m2   BUN/Creatinine Ratio NOT APPLICABLE 6 - 22 (calc)   Sodium 139 135 - 146 mmol/L   Potassium 4.7 3.5 - 5.3 mmol/L   Chloride 106 98 - 110 mmol/L   CO2 25 20 - 32 mmol/L   Calcium 9.3 8.6 - 10.3 mg/dL  Hemoglobin B7C  Result Value Ref Range   Hgb A1c MFr Bld 5.6 <5.7 % of total Hgb   Mean Plasma Glucose 114 (calc)   eAG (mmol/L) 6.3 (calc)      Assessment & Plan:   Problem List Items Addressed This Visit    Chronic pain    Secondary to OA/DJD See A&P      Relevant Medications   traMADol (ULTRAM) 50 MG tablet   CKD (chronic kidney disease), stage II    Improved chronic problem now to CKD-II , likely secondary to HTN, also history of NSAID use - Cr baseline down to 1.15  Plan: Limit NSAID meloxicam Improve hydration Follow-up Cr trend q 6 months - will defer Nephrology for future if need      Elevated hemoglobin A1c - Primary    Improved A1c from 5.7 down to 5.6 with lifestyle  Plan:  1. Not on any therapy currently  2. Encourage improved lifestyle - low carb, low sugar diet, reduce portion size, continue improving regular exercise 3. Follow-up 6 months labs       Essential (primary) hypertension    Controlled HTN - Home BP readings normal range  Complication with CKD-III - improved to CKD-II    Plan:  1. Continue current BP regimen Amlodipine 5mg  daily, Lisinopril 40mg  daily 2. Encourage improved lifestyle - low sodium diet, regular exercise 3. Continue monitor BP outside office, bring readings to next visit, if persistently >140/90 or new symptoms notify office sooner 4. Follow-up 6 months - repeat Cr trend chemistry q 6 mo      Osteoarthritis of multiple joints    Stable chronic OA/DJD multiple joints knees, hands, feet, prior problems with shoulders. -  Complicated by CKD limiting NSAID use - improved on max conservative therapy - Followed by [facility] Dr [name], referred to [facility] (orthotics, inj) - S/p last L knee steroid injection 06/21/17  Plan: 1. Discussion again today on managing his arthritis  pain meds and affect on CKD, see A&P - Continue current plan Meloxicam  TWICE weekly q 3-4 days - Refilled Tramadol  QID - monthly with refill by request - SIGNED and reviewed Controlled Substance Contract Opioids, to be scanned - Continue Diclofenac topical gel 1-2 times daily - Continue inc dose Tylenol Follow-up 6 months - request refill tramadol when ready      Relevant Medications   traMADol (ULTRAM) 50 MG tablet       Meds ordered this encounter  Medications  . traMADol (ULTRAM) 50 MG tablet    Sig: Take 1 tablet (50 mg total) by mouth 4 (four) times daily.    Dispense:  120 tablet    Refill:  2    Keep refills on file    Follow up plan: Return in about 6 months (around 06/12/2019) for Annual Physical.  Future labs ordered for 06/12/19  Saralyn Pilar, DO [facility] 12/12/2018, 2:03 PM

## 2018-12-12 NOTE — Assessment & Plan Note (Signed)
Controlled HTN - Home BP readings normal range  Complication with CKD-III - improved to CKD-II    Plan:  1. Continue current BP regimen Amlodipine 5mg  daily, Lisinopril 40mg  daily 2. Encourage improved lifestyle - low sodium diet, regular exercise 3. Continue monitor BP outside office, bring readings to next visit, if persistently >140/90 or new symptoms notify office sooner 4. Follow-up 6 months - repeat Cr trend chemistry q 6 mo

## 2018-12-12 NOTE — Patient Instructions (Addendum)
Thank you for coming to the office today.  Improved kidney function from 1.3 down to 1.15 creatinine, now considered stage II kidney disease instead of stage III, this can change in the future, keep up the good work, and we will keep track of this every 6 months.  Recent Labs    05/16/18 0828 12/08/18 0919  HGBA1C 5.7* 5.6   Improved, now no longer in Pre Diabetic stage, this is back into normal range.  DUE for FASTING BLOOD WORK (no food or drink after midnight before the lab appointment, only water or coffee without cream/sugar on the morning of)  SCHEDULE "Lab Only" visit in the morning at the clinic for lab draw in 6 MONTHS   - Make sure Lab Only appointment is at about 1 week before your next appointment, so that results will be available  For Lab Results, once available within 2-3 days of blood draw, you can can log in to MyChart online to view your results and a brief explanation. Also, we can discuss results at next follow-up visit.   Please schedule a Follow-up Appointment to: Return in about 6 months (around 06/12/2019) for Annual Physical.  If you have any other questions or concerns, please feel free to call the office or send a message through MyChart. You may also schedule an earlier appointment if necessary.  Additionally, you may be receiving a survey about your experience at our office within a few days to 1 week by e-mail or mail. We value your feedback.  Saralyn Pilar, DO [facility], [place]

## 2018-12-13 ENCOUNTER — Other Ambulatory Visit: Payer: Self-pay | Admitting: Family Medicine

## 2018-12-13 DIAGNOSIS — N138 Other obstructive and reflux uropathy: Secondary | ICD-10-CM

## 2018-12-13 DIAGNOSIS — M150 Primary generalized (osteo)arthritis: Secondary | ICD-10-CM

## 2018-12-13 DIAGNOSIS — Z0000 Encounter for general adult medical examination without abnormal findings: Secondary | ICD-10-CM

## 2018-12-13 DIAGNOSIS — E7800 Pure hypercholesterolemia, unspecified: Secondary | ICD-10-CM

## 2018-12-13 DIAGNOSIS — R7309 Other abnormal glucose: Secondary | ICD-10-CM

## 2018-12-13 DIAGNOSIS — I10 Essential (primary) hypertension: Secondary | ICD-10-CM

## 2018-12-13 DIAGNOSIS — N182 Chronic kidney disease, stage 2 (mild): Secondary | ICD-10-CM

## 2018-12-13 DIAGNOSIS — M159 Polyosteoarthritis, unspecified: Secondary | ICD-10-CM

## 2018-12-13 DIAGNOSIS — N401 Enlarged prostate with lower urinary tract symptoms: Secondary | ICD-10-CM

## 2018-12-13 NOTE — Assessment & Plan Note (Signed)
Stable chronic OA/DJD multiple joints knees, hands, feet, prior problems with shoulders. - Complicated by CKD limiting NSAID use - improved on max conservative therapy - Followed by [facility] Dr [name], referred to [facility] Foot (orthotics, inj) - S/p last L knee steroid injection 06/21/17  Plan: 1. Discussion again today on managing his arthritis pain meds and affect on CKD, see A&P - Continue current plan Meloxicam 15mg  TWICE weekly q 3-4 days - Refilled Tramadol 50mg  QID - monthly with refill by request - SIGNED and reviewed Controlled Substance Contract Opioids, to be scanned - Continue Diclofenac topical gel 1-2 times daily - Continue inc dose Tylenol Follow-up 6 months - request refill tramadol when ready

## 2018-12-13 NOTE — Assessment & Plan Note (Signed)
Improved chronic problem now to CKD-II , likely secondary to HTN, also history of NSAID use - Cr baseline down to 1.15  Plan: Limit NSAID meloxicam Improve hydration Follow-up Cr trend q 6 months - will defer Nephrology for future if need

## 2018-12-13 NOTE — Assessment & Plan Note (Signed)
Improved A1c from 5.7 down to 5.6 with lifestyle  Plan:  1. Not on any therapy currently  2. Encourage improved lifestyle - low carb, low sugar diet, reduce portion size, continue improving regular exercise 3. Follow-up 6 months labs

## 2018-12-13 NOTE — Assessment & Plan Note (Signed)
Secondary to OA/DJD See A&P

## 2018-12-17 ENCOUNTER — Other Ambulatory Visit: Payer: Self-pay | Admitting: Family Medicine

## 2018-12-17 DIAGNOSIS — N400 Enlarged prostate without lower urinary tract symptoms: Secondary | ICD-10-CM

## 2018-12-23 DIAGNOSIS — T8522XA Displacement of intraocular lens, initial encounter: Secondary | ICD-10-CM | POA: Diagnosis not present

## 2019-03-12 ENCOUNTER — Other Ambulatory Visit: Payer: Self-pay | Admitting: Family Medicine

## 2019-03-12 DIAGNOSIS — E7800 Pure hypercholesterolemia, unspecified: Secondary | ICD-10-CM

## 2019-03-13 ENCOUNTER — Other Ambulatory Visit: Payer: Self-pay | Admitting: Family Medicine

## 2019-03-13 DIAGNOSIS — M159 Polyosteoarthritis, unspecified: Secondary | ICD-10-CM

## 2019-03-17 ENCOUNTER — Other Ambulatory Visit: Payer: Self-pay

## 2019-03-17 DIAGNOSIS — G8929 Other chronic pain: Secondary | ICD-10-CM

## 2019-03-17 DIAGNOSIS — M159 Polyosteoarthritis, unspecified: Secondary | ICD-10-CM

## 2019-03-17 MED ORDER — TRAMADOL HCL 50 MG PO TABS: 50.0000 mg | ORAL_TABLET | Freq: Four times a day (QID) | ORAL | 2 refills | Status: DC

## 2019-06-08 ENCOUNTER — Telehealth: Payer: Self-pay | Admitting: Family Medicine

## 2019-06-08 NOTE — Chronic Care Management (AMB) (Signed)
°  Chronic Care Management   Outreach Note  06/08/2019 Name: Jerry Simmons MRN: [id number] DOB: 1946/03/23  Referred by: Olin Hauser, DO Reason for referral : Chronic Care Management (Third CCM outreach was unsuccessful. )   Third unsuccessful telephone outreach was attempted today. The patient was referred to the case management team for assistance with chronic care management and care coordination. The patient's primary care provider has been notified of our unsuccessful attempts to make or maintain contact with the patient. The care management team is pleased to engage with this patient at any time in the future should he/she be interested in assistance from the care management team.   Follow Up Plan: The care management team is available to follow up with the patient after provider conversation with the patient regarding recommendation for care management engagement and subsequent re-referral to the care management team.   [place]  [email or web address]   ??[phone]

## 2019-06-12 ENCOUNTER — Other Ambulatory Visit: Payer: Self-pay | Admitting: Family Medicine

## 2019-06-12 ENCOUNTER — Other Ambulatory Visit: Payer: Medicare HMO

## 2019-06-12 ENCOUNTER — Other Ambulatory Visit: Payer: Self-pay

## 2019-06-12 DIAGNOSIS — N401 Enlarged prostate with lower urinary tract symptoms: Secondary | ICD-10-CM | POA: Diagnosis not present

## 2019-06-12 DIAGNOSIS — E7800 Pure hypercholesterolemia, unspecified: Secondary | ICD-10-CM

## 2019-06-12 DIAGNOSIS — Z0000 Encounter for general adult medical examination without abnormal findings: Secondary | ICD-10-CM

## 2019-06-12 DIAGNOSIS — M159 Polyosteoarthritis, unspecified: Secondary | ICD-10-CM

## 2019-06-12 DIAGNOSIS — R7309 Other abnormal glucose: Secondary | ICD-10-CM | POA: Diagnosis not present

## 2019-06-12 DIAGNOSIS — N182 Chronic kidney disease, stage 2 (mild): Secondary | ICD-10-CM

## 2019-06-12 DIAGNOSIS — I10 Essential (primary) hypertension: Secondary | ICD-10-CM | POA: Diagnosis not present

## 2019-06-12 DIAGNOSIS — N138 Other obstructive and reflux uropathy: Secondary | ICD-10-CM

## 2019-06-12 DIAGNOSIS — G8929 Other chronic pain: Secondary | ICD-10-CM

## 2019-06-12 NOTE — Telephone Encounter (Signed)
Pt called requesting refill on Tramadol °

## 2019-06-13 LAB — CBC WITH DIFFERENTIAL/PLATELET
Absolute Monocytes: 389 cells/uL (ref 200–950)
Basophils Absolute: 41 cells/uL (ref 0–200)
Basophils Relative: 0.7 %
Eosinophils Absolute: 360 cells/uL (ref 15–500)
Eosinophils Relative: 6.2 %
HCT: 42.5 % (ref 38.5–50.0)
Hemoglobin: 13.8 g/dL (ref 13.2–17.1)
Lymphs Abs: 1479 cells/uL (ref 850–3900)
MCH: 29.6 pg (ref 27.0–33.0)
MCHC: 32.5 g/dL (ref 32.0–36.0)
MCV: 91.2 fL (ref 80.0–100.0)
MPV: 10 fL (ref 7.5–12.5)
Monocytes Relative: 6.7 %
Neutro Abs: 3532 cells/uL (ref 1500–7800)
Neutrophils Relative %: 60.9 %
Platelets: 227 10*3/uL (ref 140–400)
RBC: 4.66 10*6/uL (ref 4.20–5.80)
RDW: 13.9 % (ref 11.0–15.0)
Total Lymphocyte: 25.5 %
WBC: 5.8 10*3/uL (ref 3.8–10.8)

## 2019-06-13 LAB — LIPID PANEL
Cholesterol: 196 mg/dL (ref <200)
HDL: 76 mg/dL (ref >=40)
LDL Cholesterol (Calc): 106 mg/dL (calc) — ABNORMAL HIGH
Non-HDL Cholesterol (Calc): 120 mg/dL (calc) (ref <130)
Total CHOL/HDL Ratio: 2.6 (calc) (ref <5.0)
Triglycerides: 54 mg/dL (ref <150)

## 2019-06-13 LAB — COMPLETE METABOLIC PANEL WITH GFR
AG Ratio: 1.7 (calc) (ref 1.0–2.5)
ALT: 10 U/L (ref 9–46)
AST: 15 U/L (ref 10–35)
Albumin: 4.1 g/dL (ref 3.6–5.1)
Alkaline phosphatase (APISO): 74 U/L (ref 35–144)
BUN/Creatinine Ratio: 22 (calc) (ref 6–22)
BUN: 30 mg/dL — ABNORMAL HIGH (ref 7–25)
CO2: 21 mmol/L (ref 20–32)
Calcium: 9.2 mg/dL (ref 8.6–10.3)
Chloride: 111 mmol/L — ABNORMAL HIGH (ref 98–110)
Creat: 1.36 mg/dL — ABNORMAL HIGH (ref 0.70–1.18)
GFR, Est African American: 60 mL/min/{1.73_m2} (ref >=60)
GFR, Est Non African American: 52 mL/min/{1.73_m2} — ABNORMAL LOW (ref >=60)
Globulin: 2.4 g/dL (calc) (ref 1.9–3.7)
Glucose, Bld: 85 mg/dL (ref 65–99)
Potassium: 4.2 mmol/L (ref 3.5–5.3)
Sodium: 141 mmol/L (ref 135–146)
Total Bilirubin: 0.5 mg/dL (ref 0.2–1.2)
Total Protein: 6.5 g/dL (ref 6.1–8.1)

## 2019-06-13 LAB — HEMOGLOBIN A1C
Hgb A1c MFr Bld: 5.6 % of total Hgb (ref <5.7)
Mean Plasma Glucose: 114 (calc)
eAG (mmol/L): 6.3 (calc)

## 2019-06-13 LAB — PSA: PSA: 1.4 ng/mL (ref <=4.0)

## 2019-06-14 MED ORDER — TRAMADOL HCL 50 MG PO TABS: 50.0000 mg | ORAL_TABLET | Freq: Four times a day (QID) | ORAL | 2 refills | Status: DC

## 2019-06-17 ENCOUNTER — Other Ambulatory Visit: Payer: Self-pay | Admitting: Family Medicine

## 2019-06-17 DIAGNOSIS — I10 Essential (primary) hypertension: Secondary | ICD-10-CM

## 2019-06-19 ENCOUNTER — Encounter: Payer: Self-pay | Admitting: Family Medicine

## 2019-06-19 ENCOUNTER — Ambulatory Visit (INDEPENDENT_AMBULATORY_CARE_PROVIDER_SITE_OTHER): Payer: Medicare HMO | Admitting: Family Medicine

## 2019-06-19 ENCOUNTER — Other Ambulatory Visit: Payer: Self-pay

## 2019-06-19 VITALS — BP 105/43 | HR 138 | Temp 98.0°F | Resp 16 | Ht 70.0 in | Wt 148.0 lb

## 2019-06-19 DIAGNOSIS — I129 Hypertensive chronic kidney disease with stage 1 through stage 4 chronic kidney disease, or unspecified chronic kidney disease: Secondary | ICD-10-CM | POA: Diagnosis not present

## 2019-06-19 DIAGNOSIS — N401 Enlarged prostate with lower urinary tract symptoms: Secondary | ICD-10-CM | POA: Diagnosis not present

## 2019-06-19 DIAGNOSIS — E7800 Pure hypercholesterolemia, unspecified: Secondary | ICD-10-CM | POA: Diagnosis not present

## 2019-06-19 DIAGNOSIS — R7309 Other abnormal glucose: Secondary | ICD-10-CM

## 2019-06-19 DIAGNOSIS — N183 Chronic kidney disease, stage 3 unspecified: Secondary | ICD-10-CM

## 2019-06-19 DIAGNOSIS — N138 Other obstructive and reflux uropathy: Secondary | ICD-10-CM

## 2019-06-19 DIAGNOSIS — M159 Polyosteoarthritis, unspecified: Secondary | ICD-10-CM

## 2019-06-19 DIAGNOSIS — M150 Primary generalized (osteo)arthritis: Secondary | ICD-10-CM | POA: Diagnosis not present

## 2019-06-19 DIAGNOSIS — Z23 Encounter for immunization: Secondary | ICD-10-CM

## 2019-06-19 DIAGNOSIS — Z1211 Encounter for screening for malignant neoplasm of colon: Secondary | ICD-10-CM | POA: Diagnosis not present

## 2019-06-19 DIAGNOSIS — Z0000 Encounter for general adult medical examination without abnormal findings: Secondary | ICD-10-CM

## 2019-06-19 NOTE — Patient Instructions (Addendum)
Thank you for coming to the office today.  Flu shot today.  Notify us if you are ready to schedule for a Knee injection.  BP low as discussed, keep a close watch on BP, goal is >100/60. If after 1 week still low, can contact us with numbers and or try to cut tab in half - either Lisinopril 438m or Amlodipine 576m  Make sure you have the Amlodipine 38m59mLast time my screen says it was ordered from me in 12/2017  Colon Cancer Screening: - For all adults age 64+26+utine colon cancer screening is highly recommended.     - Recent guidelines from [place] starting age of 45 41Early detection of colon cancer is important, because often there are no warning signs or symptoms, also if found early usually it can be cured. Late stage is hard to treat.  - If you are not interested in Colonoscopy screening (if done and normal you could be cleared for 5 to 10 years until next due), then Cologuard is an excellent alternative for screening test for Colon Cancer. It is highly sensitive for detecting DNA of colon cancer from even the earliest stages. Also, there is NO bowel prep required. - If Cologuard is NEGATIVE, then it is good for 3 years before next due - If Cologuard is POSITIVE, then it is strongly advised to get a Colonoscopy, which allows the GI doctor to locate the source of the cancer or polyp (even very early stage) and treat it by removing it. ------------------------- If you would like to proceed with Cologuard (stool DNA test) - FIRST, call your insurance company and tell them you want to check cost of Cologuard tell them CPT Code [id number] may be completely covered and you could get for no cost, OR max cost without any coverage is about $600). Also, keep in mind if you do NOT open the kit, and decide not to do the test, you will NOT be charged, you should contact the company if you decide not to do the test. - If you want to proceed, you can notify us [place] message,  [place] at next visit) and we will order it for you. The test kit will be delivered to you house within about 1 week. Follow instructions to collect sample, you may call the company for any help or questions, 24/7 telephone support at [phone] Please schedule a Follow-up Appointment to: Return in about 6 months (around 12/17/2019) for 6 month follow-up HTN, Pain, Arthritis.  If you have any other questions or concerns, please feel free to call the office or send a message through [facility] may also schedule an earlier appointment if necessary.  Additionally, you may be receiving a survey about your experience at our office within a few days to 1 week by e-mail or mail. We value your feedback.  [place]ie [place]

## 2019-06-19 NOTE — Assessment & Plan Note (Signed)
Improved A1c still down to 5.6 with lifestyle  Plan:  1. Not on any therapy currently  2. Encourage improved lifestyle - low carb, low sugar diet, reduce portion size, continue improving regular exercise  Check q 6 -12 month

## 2019-06-19 NOTE — Assessment & Plan Note (Signed)
Chronic BPH, improved nocturia now Continues on finasteride and tamsulosin

## 2019-06-19 NOTE — Assessment & Plan Note (Signed)
Controlled HTN previously, some low BP readings recently, asymptomatic - Home BP readings reviewed, limited sample Complication with CKD-III, fluctuation of Cr    Plan:  1. Continue current BP regimen Amlodipine 5mg  daily, Lisinopril 40mg  daily 2. Encourage improved lifestyle - low sodium diet, regular exercise 3. Continue monitor BP outside office, bring readings to next visit, if persistently >140/90 or new symptoms notify office sooner  Advised can adjust BP by taking half of one of his meds if preferred now if still low BP as per recommendations. Likely reduce lisinopril from 40 to 20

## 2019-06-19 NOTE — Assessment & Plan Note (Signed)
Stable chronic OA/DJD multiple joints knees, hands, feet, prior problems with shoulders. - Complicated by CKD limiting NSAID use - improved on max conservative therapy - Followed by [facility] Dr Mack Guise - S/p last L knee steroid injection 06/21/17 only temporary relief  Plan: 1. Discussion again today on managing his arthritis pain meds and affect on CKD, see A&P - Continue current plan Meloxicam 15mg  TWICE weekly q 3-4 days - Continue Tramadol 50mg  QID - reviewed [place] CSRS PMP AWARE and Controlled Opioid agreement on file - Continue Diclofenac topical gel 1-2 times daily - Continue inc dose Tylenol Follow-up 6 months  May return for R knee steroid injection when ready

## 2019-06-19 NOTE — Assessment & Plan Note (Signed)
Mostly controlled cholesterol on statin and lifestyle Last lipid panel 05/2019 Calculated ASCVD 10 yr risk score >15%  Plan: 1. Continue Lovastatin 20mg  daily now 2. Counseling on ASCVD risk reduction 3. Encourage improved lifestyle - low carb/cholesterol, reduce portion size, continue improving regular exercise

## 2019-06-19 NOTE — Assessment & Plan Note (Addendum)
Fluctuating Cr / GFR now back to CKD-III, slightly elevated Cr  likely secondary to HTN, also history of NSAID use - Cr baseline 1.15  To 1.3  Plan: Limit NSAID meloxicam Improve hydration May check  Cr trend q 6-12 months - will defer Nephrology for future if need

## 2019-06-19 NOTE — Progress Notes (Signed)
Subjective:    Patient ID: Jerry Simmons, male    DOB: 03/20/1946, 72 y.o.   MRN: [id number]  Jerry Simmons is a 73 y.o. male presenting on 06/19/2019 for Annual Exam   HPI   Here for Annual Physical and lab Review.  Elevated A1c A1c improved to 5.6. Stable from previous. - Not taking medicine - Lifestyle: - Weight down 10 lbs in 6 months - Diet: Improving diet, less sweets, more water - Exercise: active, walking  Osteoarthritis, Multiple joints (knees, hands, feet)/ Chronic Knee Pain Chronic problem with arthritis On controlled subs agreement opoid for Tramadol, doing well, taking QID rarely takes a half or skip a dose. - Taking Meloxicam 15mg  x 2 a week only, with some relief Diclofenac gel topical 1-2x times most days (hands, feet, knees), Tylenol Ext Str 500mg  2-3 times daily - previous knee injection L in 2018 temporary relief, interested in R side in future. Prior [facility] Dr [name] - He is able to maintain daily function -Alsotaking Glucosamine 1500mg  and Chondroitin 1200mg  twice daily  CHRONIC HTN / CKD-III Cr up to 1.36, previously 1.5 to 1.3 baseline, seems stable He does take NSAID intermittent - Asymptomatic, no problems significantly with low BP he does not check regularly but recently he has been checking. Current Meds - Amlodipine 5mg  daily, Lisinopril 40mg  daily   Reports good compliance, took meds today. Tolerating well, w/o complaints. Denies CP, dyspnea, HA, edema, dizziness / lightheadedness  BPH Previously more nocturia, but now within 6 months ago has improved less nocturia - Chronic problem. Previously followed by [facility] Dr Anola Gurney, he has not returned for a while, he takes medications Finasteride 5mg  daily and Tamsulosin 0.4mg  daily with good results  HYPERLIPIDEMIA: - Reports no concerns. Last lipid 05/2019,improved controlled On Lovastatin 20mg  daily, tolerating w/o side effect  Health Maintenance: - UTD PNA  vaccines. - UTD Shingrix - UTD Hep C routine screen - Due flu vaccine, to be given today  - Last colonoscopy 2009,was told 10 years to repeat, had no polyps. Interested in cologuard now.   Depression screen [facility] 2/9 06/19/2019 12/12/2018 05/26/2018  Decreased Interest 0 0 0  Down, Depressed, Hopeless 0 0 0  PHQ - 2 Score 0 0 0    Past Medical History:  Diagnosis Date  . Arthritis    Past Surgical History:  Procedure Laterality Date  . EYE SURGERY  12/10/2018   Right eye   Social History   Socioeconomic History  . Marital status: Married    Spouse name: Not on file  . Number of children: Not on file  . Years of education: Not on file  . Highest education level: Not on file  Occupational History  . Not on file  Social Needs  . Financial resource strain: Not on file  . Food insecurity    Worry: Not on file    Inability: Not on file  . Transportation needs    Medical: Not on file    Non-medical: Not on file  Tobacco Use  . Smoking status: Former Smoker    Packs/day: 3.00    Years: 20.00    Pack years: 60.00    Types: Cigarettes    Quit date: 09/16/1985    Years since quitting: 33.7  . Smokeless tobacco: Former Engineer, water and Sexual Activity  . Alcohol use: Yes    Alcohol/week: 1.0 standard drinks    Types: 1 Glasses of wine per week    Comment: Only has  wine on occassional.   . Drug use: No  . Sexual activity: Not on file  Lifestyle  . Physical activity    Days per week: Not on file    Minutes per session: Not on file  . Stress: Not on file  Relationships  . Social Musician on phone: Not on file    Gets together: Not on file    Attends religious service: Not on file    Active member of club or organization: Not on file    Attends meetings of clubs or organizations: Not on file    Relationship status: Not on file  . Intimate partner violence    Fear of current or ex partner: Not on file    Emotionally abused: Not on file    Physically  abused: Not on file    Forced sexual activity: Not on file  Other Topics Concern  . Not on file  Social History Narrative  . Not on file   No family history on file. Current Outpatient Medications on File Prior to Visit  Medication Sig  . amLODipine (NORVASC) 5 MG tablet Take 1 tablet (5 mg total) by mouth daily.  . diclofenac sodium (VOLTAREN) 1 % GEL Apply 2 g topically 3 (three) times daily as needed. For knees and hands, arthritis  . erythromycin ophthalmic ointment   . finasteride (PROSCAR) 5 MG tablet TAKE 1 TABLET EVERY DAY  . Glucosamine-Chondroit-Vit C-Mn (GLUCOSAMINE 1500 COMPLEX PO) Take 1,500 mg 2 (two) times daily by mouth.  Marland Kitchen lisinopril (ZESTRIL) 40 MG tablet TAKE 1 TABLET EVERY DAY  . lovastatin (MEVACOR) 20 MG tablet TAKE 1 TABLET EVERY DAY  . meloxicam (MOBIC) 15 MG tablet TAKE 1 TABLET EVERY OTHER DAY  (TAKE ON A RARE BASIS ONLY)  . moxifloxacin (VIGAMOX) 0.5 % ophthalmic solution   . prednisoLONE acetate (PRED FORTE) 1 % ophthalmic suspension Prednisolone Acetate 1%, 1 drop 4 times per day in the operative eye, for 4 weeks  . tamsulosin (FLOMAX) 0.4 MG CAPS capsule Take 1 capsule (0.4 mg total) by mouth daily.  . traMADol (ULTRAM) 50 MG tablet Take 1 tablet (50 mg total) by mouth 4 (four) times daily.   No current facility-administered medications on file prior to visit.     Review of Systems  Constitutional: Negative for activity change, appetite change, chills, diaphoresis, fatigue and fever.  HENT: Negative for congestion and hearing loss.   Eyes: Negative for visual disturbance.  Respiratory: Negative for cough, chest tightness, shortness of breath and wheezing.   Cardiovascular: Negative for chest pain, palpitations and leg swelling.  Gastrointestinal: Negative for abdominal pain, constipation, diarrhea, nausea and vomiting.  Genitourinary: Negative for dysuria, frequency and hematuria.  Musculoskeletal: Negative for arthralgias and neck pain.  Skin:  Negative for rash.  Neurological: Negative for dizziness, weakness, light-headedness, numbness and headaches.  Hematological: Negative for adenopathy.  Psychiatric/Behavioral: Negative for behavioral problems, dysphoric mood and sleep disturbance.   Per HPI unless specifically indicated above     Objective:    BP (!) 105/43   Pulse (!) 138   Temp 98 F (36.7 C) (Oral)   Resp 16   Ht 5\' 10"  (1.778 m)   Wt 148 lb (67.1 kg)   BMI 21.24 kg/m   Wt Readings from Last 3 Encounters:  06/19/19 148 lb (67.1 kg)  12/12/18 158 lb (71.7 kg)  05/26/18 164 lb (74.4 kg)    Physical Exam Vitals signs and nursing note reviewed.  Constitutional:  General: He is not in acute distress.    Appearance: He is well-developed. He is not diaphoretic.     Comments: Well-appearing, comfortable, cooperative  HENT:     Head: Normocephalic and atraumatic.     Right Ear: Tympanic membrane, ear canal and external ear normal.     Left Ear: Ear canal normal.     Ears:     Comments: Cerumen in L ear Eyes:     General:        Right eye: No discharge.        Left eye: No discharge.     Conjunctiva/sclera: Conjunctivae normal.     Pupils: Pupils are equal, round, and reactive to light.  Neck:     Musculoskeletal: Normal range of motion and neck supple.     Thyroid: No thyromegaly.     Comments: No carotid bruits Cardiovascular:     Rate and Rhythm: Normal rate and regular rhythm.     Heart sounds: Normal heart sounds. No murmur.  Pulmonary:     Effort: Pulmonary effort is normal. No respiratory distress.     Breath sounds: Normal breath sounds. No wheezing or rales.  Abdominal:     General: Bowel sounds are normal. There is no distension.     Palpations: Abdomen is soft. There is no mass.     Tenderness: There is no abdominal tenderness.  Musculoskeletal: Normal range of motion.        General: No tenderness.     Comments: Upper / Lower Extremities: - Normal muscle tone, strength bilateral  upper extremities 5/5, lower extremities 5/5  Lymphadenopathy:     Cervical: No cervical adenopathy.  Skin:    General: Skin is warm and dry.     Findings: No erythema or rash.  Neurological:     Mental Status: He is alert and oriented to person, place, and time.     Comments: Distal sensation intact to light touch all extremities  Psychiatric:        Behavior: Behavior normal.     Comments: Well groomed, good eye contact, normal speech and thoughts    Recent Labs    12/08/18 0919 06/12/19 0805  HGBA1C 5.6 5.6    Results for orders placed or performed in visit on 06/12/19  PSA  Result Value Ref Range   PSA 1.4 < OR = 4.0 ng/mL  Lipid panel  Result Value Ref Range   Cholesterol 196 <200 mg/dL   HDL 76 > OR = 40 mg/dL   Triglycerides 54 <161<150 mg/dL   LDL Cholesterol (Calc) 106 (H) mg/dL (calc)   Total CHOL/HDL Ratio 2.6 <5.0 (calc)   Non-HDL Cholesterol (Calc) 120 <130 mg/dL (calc)  COMPLETE METABOLIC PANEL WITH GFR  Result Value Ref Range   Glucose, Bld 85 65 - 99 mg/dL   BUN 30 (H) 7 - 25 mg/dL   Creat 0.961.36 (H) 0.450.70 - 1.18 mg/dL   GFR, Est Non African American 52 (L) > OR = 60 mL/min/1.1673m2   GFR, Est African American 60 > OR = 60 mL/min/1.3873m2   BUN/Creatinine Ratio 22 6 - 22 (calc)   Sodium 141 135 - 146 mmol/L   Potassium 4.2 3.5 - 5.3 mmol/L   Chloride 111 (H) 98 - 110 mmol/L   CO2 21 20 - 32 mmol/L   Calcium 9.2 8.6 - 10.3 mg/dL   Total Protein 6.5 6.1 - 8.1 g/dL   Albumin 4.1 3.6 - 5.1 g/dL   Globulin 2.4 1.9 -  3.7 g/dL (calc)   AG Ratio 1.7 1.0 - 2.5 (calc)   Total Bilirubin 0.5 0.2 - 1.2 mg/dL   Alkaline phosphatase (APISO) 74 35 - 144 U/L   AST 15 10 - 35 U/L   ALT 10 9 - 46 U/L  CBC with Differential/Platelet  Result Value Ref Range   WBC 5.8 3.8 - 10.8 Thousand/uL   RBC 4.66 4.20 - 5.80 Million/uL   Hemoglobin 13.8 13.2 - 17.1 g/dL   HCT 86.542.5 78.438.5 - 69.650.0 %   MCV 91.2 80.0 - 100.0 fL   MCH 29.6 27.0 - 33.0 pg   MCHC 32.5 32.0 - 36.0 g/dL   RDW  29.513.9 28.411.0 - 13.215.0 %   Platelets 227 140 - 400 Thousand/uL   MPV 10.0 7.5 - 12.5 fL   Neutro Abs 3,532 1,500 - 7,800 cells/uL   Lymphs Abs 1,479 850 - 3,900 cells/uL   Absolute Monocytes 389 200 - 950 cells/uL   Eosinophils Absolute 360 15 - 500 cells/uL   Basophils Absolute 41 0 - 200 cells/uL   Neutrophils Relative % 60.9 %   Total Lymphocyte 25.5 %   Monocytes Relative 6.7 %   Eosinophils Relative 6.2 %   Basophils Relative 0.7 %  Hemoglobin A1c  Result Value Ref Range   Hgb A1c MFr Bld 5.6 <5.7 % of total Hgb   Mean Plasma Glucose 114 (calc)   eAG (mmol/L) 6.3 (calc)      Assessment & Plan:   Problem List Items Addressed This Visit    Benign hypertension with CKD (chronic kidney disease) stage III (HCC)    Controlled HTN previously, some low BP readings recently, asymptomatic - Home BP readings reviewed, limited sample Complication with CKD-III, fluctuation of Cr    Plan:  1. Continue current BP regimen Amlodipine 5mg  daily, Lisinopril 40mg  daily 2. Encourage improved lifestyle - low sodium diet, regular exercise 3. Continue monitor BP outside office, bring readings to next visit, if persistently >140/90 or new symptoms notify office sooner  Advised can adjust BP by taking half of one of his meds if preferred now if still low BP as per recommendations. Likely reduce lisinopril from 40 to 20      BPH with obstruction/lower urinary tract symptoms    Chronic BPH, improved nocturia now Continues on finasteride and tamsulosin       CKD (chronic kidney disease), stage III (HCC)    Fluctuating Cr / GFR now back to CKD-III, slightly elevated Cr  likely secondary to HTN, also history of NSAID use - Cr baseline 1.15  To 1.3  Plan: Limit NSAID meloxicam Improve hydration May check  Cr trend q 6-12 months - will defer Nephrology for future if need      Elevated hemoglobin A1c    Improved A1c still down to 5.6 with lifestyle  Plan:  1. Not on any therapy currently  2.  Encourage improved lifestyle - low carb, low sugar diet, reduce portion size, continue improving regular exercise  Check q 6 -12 month      Hypercholesteremia    Mostly controlled cholesterol on statin and lifestyle Last lipid panel 05/2019 Calculated ASCVD 10 yr risk score >15%  Plan: 1. Continue Lovastatin 20mg  daily now 2. Counseling on ASCVD risk reduction 3. Encourage improved lifestyle - low carb/cholesterol, reduce portion size, continue improving regular exercise      Osteoarthritis of multiple joints    Stable chronic OA/DJD multiple joints knees, hands, feet, prior problems with shoulders. -  Complicated by CKD limiting NSAID use - improved on max conservative therapy - Followed by [facility] Dr Mack Guise - S/p last L knee steroid injection 06/21/17 only temporary relief  Plan: 1. Discussion again today on managing his arthritis pain meds and affect on CKD, see A&P - Continue current plan Meloxicam 15mg  TWICE weekly q 3-4 days - Continue Tramadol 50mg  QID - reviewed [place] CSRS PMP AWARE and Controlled Opioid agreement on file - Continue Diclofenac topical gel 1-2 times daily - Continue inc dose Tylenol Follow-up 6 months  May return for R knee steroid injection when ready       Other Visit Diagnoses    Annual physical exam    -  Primary   Needs flu shot       Relevant Orders   Flu Vaccine QUAD High Dose(Fluad) (Completed)   Screening for colon cancer       Relevant Orders   Cologuard      Updated Health Maintenance information - Flu shot today  Due for routine colon cancer screening. Last colonoscopy 7 yr ago, see above - Discussion today about recommendations for either Colonoscopy or Cologuard screening, benefits and risks of screening, interested in Cologuard, understands that if positive then recommendation is for diagnostic colonoscopy to follow-up. - Ordered Cologuard today  Reviewed recent lab results with patient Encouraged improvement to lifestyle  with diet and exercise -Maintain healthy weight   No orders of the defined types were placed in this encounter.   Follow up plan: Return in about 6 months (around 12/17/2019) for 6 month follow-up HTN, Pain, Arthritis.  Nobie Putnam, DO [facility] 06/19/2019, 2:32 PM

## 2019-06-26 DIAGNOSIS — Z1212 Encounter for screening for malignant neoplasm of rectum: Secondary | ICD-10-CM | POA: Diagnosis not present

## 2019-06-26 DIAGNOSIS — Z1211 Encounter for screening for malignant neoplasm of colon: Secondary | ICD-10-CM | POA: Diagnosis not present

## 2019-06-26 LAB — COLOGUARD: Cologuard: NEGATIVE

## 2019-07-31 ENCOUNTER — Other Ambulatory Visit: Payer: Self-pay

## 2019-07-31 ENCOUNTER — Encounter: Payer: Self-pay | Admitting: Family Medicine

## 2019-07-31 ENCOUNTER — Ambulatory Visit (INDEPENDENT_AMBULATORY_CARE_PROVIDER_SITE_OTHER): Payer: Medicare HMO | Admitting: Family Medicine

## 2019-07-31 VITALS — BP 138/59 | HR 57 | Temp 98.3°F | Resp 16 | Ht 70.0 in | Wt 152.0 lb

## 2019-07-31 DIAGNOSIS — M25561 Pain in right knee: Secondary | ICD-10-CM | POA: Diagnosis not present

## 2019-07-31 DIAGNOSIS — G8929 Other chronic pain: Secondary | ICD-10-CM | POA: Diagnosis not present

## 2019-07-31 DIAGNOSIS — M159 Polyosteoarthritis, unspecified: Secondary | ICD-10-CM

## 2019-07-31 DIAGNOSIS — M8949 Other hypertrophic osteoarthropathy, multiple sites: Secondary | ICD-10-CM

## 2019-07-31 MED ORDER — LIDOCAINE HCL (PF) 1 % IJ SOLN
4.0000 mL | Freq: Once | INTRAMUSCULAR | Status: AC
  Administered 2019-07-31: 4 mL

## 2019-07-31 MED ORDER — METHYLPREDNISOLONE ACETATE 40 MG/ML IJ SUSP
40.0000 mg | Freq: Once | INTRAMUSCULAR | Status: AC
  Administered 2019-07-31: 40 mg via INTRA_ARTICULAR

## 2019-07-31 NOTE — Patient Instructions (Addendum)
Thank you for coming to the office today.  You received a Right Knee Joint steroid injection today. - Lidocaine numbing medicine may ease the pain initially for a few hours until it wears off - As discussed, you may experience a "steroid flare" this evening or within 24-48 hours, anytime medicine is injected into an inflamed joint it can cause the pain to get worse temporarily - Everyone responds differently to these injections, it depends on the patient and the severity of the joint problem, it may provide anywhere from days to weeks, to months of relief. Ideal response is >6 months relief - Try to take it easy for next 1-2 days, avoid over activity and strain on joint (limit walking for knee) - Recommend the following:   - For swelling - rest, compression sleeve / ACE wrap, elevation, and ice packs as needed for first few days   - For pain in future may use heating pad or moist heat as needed  Consider return to Dr Mack Guise - if this injection is not successful can consider other types of Orthopedic injection such as Viscus or Synvisc type injection to improve cartilage / lubrication and function of knee instead.  Please schedule a Follow-up Appointment to: Return in about 3 months (around 10/31/2019), or if symptoms worsen or fail to improve, for knee pain.  If you have any other questions or concerns, please feel free to call the office or send a message through [place]. You may also schedule an earlier appointment if necessary.  Additionally, you may be receiving a survey about your experience at our office within a few days to 1 week by e-mail or mail. We value your feedback.  Nobie Putnam, DO [place]

## 2019-07-31 NOTE — Assessment & Plan Note (Signed)
Episodic pain R knee medial, currently stable Chronic OA/DJD multiple joints knees, hands, feet, prior problems with shoulders. - Complicated by CKD limiting NSAID use - improved on max conservative therapy - Followed by [facility] Dr Mack Guise - S/p last L knee steroid injection 06/21/17 only temporary relief  Plan: 1. Proceed with R knee steroid injection today - counseling provided, see procedure note, tolerated well. - Continue current plan Meloxicam 15mg  TWICE weekly q 3-4 days - Continue Tramadol 50mg  QID - reviewed [place] CSRS PMP AWARE - Continue Diclofenac topical gel 1-2 times daily - Continue inc dose Tylenol Follow-up as scheduled in 2021  He may consider return to [facility] Dr Mack Guise to consider viscus injection series

## 2019-07-31 NOTE — Progress Notes (Signed)
Subjective:    Patient ID: Jerry Simmons, male    DOB: 11/20/1945, 73 y.o.   MRN: [id number]  Jerry Simmons is a 73 y.o. male presenting on 07/31/2019 for Knee Pain (Right side)   HPI   Osteoarthritis, Multiple joints (knees, hands, feet)/ Chronic Knee Pain Chronic problem with arthritis, last seen for same issue multiple times, most recent 06/2019. Today he returns for R knee steroid injection. He has had injection in L knee before from orthopedic. He admits to occasional episodic pain in R knee medial aspect primarily, not a constant pain. He admits to a grinding feeling at times. On controlled subs agreement opoid for Tramadol, doing well, taking QID rarely takes a half or skip a dose. - Taking Meloxicam 15mg  x 2 a week only, with some relief Diclofenac gel topical 1-2x times most days (hands, feet, knees), Tylenol Ext Str 500mg  2-3 times daily - previous knee injection L in 2018 temporary relief Prior [facility] Dr - He is able to maintain daily function -Alsotaking Glucosamine 1500mg  and Chondroitin 1200mg  twice daily Denies redness swelling worsening pain, numbness tingling weakness other joint problem.   Depression screen [facility] 2/9 07/31/2019 06/19/2019 12/12/2018  Decreased Interest 0 0 0  Down, Depressed, Hopeless 0 0 0  PHQ - 2 Score 0 0 0    Social History   Tobacco Use   Smoking status: Former Smoker    Packs/day: 3.00    Years: 20.00    Pack years: 60.00    Types: Cigarettes    Quit date: 09/16/1985    Years since quitting: 33.8   Smokeless tobacco: Former 08/02/2019  Substance Use Topics   Alcohol use: Yes    Alcohol/week: 1.0 standard drinks    Types: 1 Glasses of wine per week    Comment: Only has wine on occassional.    Drug use: No    Review of Systems Per HPI unless specifically indicated above     Objective:    BP (!) 138/59    Pulse (!) 57    Temp 98.3 F (36.8 C) (Oral)    Resp 16    Ht 5\' 10"  (1.778 m)    Wt 152 lb (68.9 kg)     BMI 21.81 kg/m   Wt Readings from Last 3 Encounters:  07/31/19 152 lb (68.9 kg)  06/19/19 148 lb (67.1 kg)  12/12/18 158 lb (71.7 kg)    Physical Exam Vitals signs and nursing note reviewed.  Constitutional:      General: He is not in acute distress.    Appearance: He is well-developed. He is not diaphoretic.     Comments: Well-appearing, comfortable, cooperative  HENT:     Head: Normocephalic and atraumatic.  Eyes:     General:        Right eye: No discharge.        Left eye: No discharge.     Conjunctiva/sclera: Conjunctivae normal.  Cardiovascular:     Rate and Rhythm: Normal rate.  Pulmonary:     Effort: Pulmonary effort is normal.  Musculoskeletal:     Comments: Right Knee Inspection: Slightly bulky appearance. No ecchymosis or effusion. Palpation: Non-tender today area of pain usually medial joint line. Moderate amount of fine crepitus diffusely ROM: Full active ROM bilaterally Strength: 5/5 intact knee flex/ext, ankle dorsi/plantarflex Neurovascular: distally intact sensation light touch and pulses   Skin:    General: Skin is warm and dry.     Findings: No erythema or rash.  Neurological:     Mental Status: He is alert and oriented to person, place, and time.  Psychiatric:        Behavior: Behavior normal.     Comments: Well groomed, good eye contact, normal speech and thoughts      ________________________________________________________ PROCEDURE NOTE Date: 07/31/19 Right knee corticosteroid injection Discussed benefits and risks (including pain, bleeding, infection, steroid flare). Verbal consent given by patient. Medication:  1 cc Depo-medrol 40mg  and 4 cc Lidocaine 1% without epi Time Out taken  Landmarks identified. Area cleansed with alcohol wipes. Using 21 gauge and 1, 1/2 inch needle, Right knee joint was injected (with above listed medication) via medial approach cold spray used for superficial anesthetic. Sterile bandage placed. Patient tolerated  procedure well without bleeding or paresthesias. No complications.   Results for orders placed or performed in visit on 06/12/19  PSA  Result Value Ref Range   PSA 1.4 < OR = 4.0 ng/mL  Lipid panel  Result Value Ref Range   Cholesterol 196 <200 mg/dL   HDL 76 > OR = 40 mg/dL   Triglycerides 54 <150 mg/dL   LDL Cholesterol (Calc) 106 (H) mg/dL (calc)   Total CHOL/HDL Ratio 2.6 <5.0 (calc)   Non-HDL Cholesterol (Calc) 120 <130 mg/dL (calc)  COMPLETE METABOLIC PANEL WITH GFR  Result Value Ref Range   Glucose, Bld 85 65 - 99 mg/dL   BUN 30 (H) 7 - 25 mg/dL   Creat 1.36 (H) 0.70 - 1.18 mg/dL   GFR, Est Non African American 52 (L) > OR = 60 mL/min/1.60m2   GFR, Est African American 60 > OR = 60 mL/min/1.71m2   BUN/Creatinine Ratio 22 6 - 22 (calc)   Sodium 141 135 - 146 mmol/L   Potassium 4.2 3.5 - 5.3 mmol/L   Chloride 111 (H) 98 - 110 mmol/L   CO2 21 20 - 32 mmol/L   Calcium 9.2 8.6 - 10.3 mg/dL   Total Protein 6.5 6.1 - 8.1 g/dL   Albumin 4.1 3.6 - 5.1 g/dL   Globulin 2.4 1.9 - 3.7 g/dL (calc)   AG Ratio 1.7 1.0 - 2.5 (calc)   Total Bilirubin 0.5 0.2 - 1.2 mg/dL   Alkaline phosphatase (APISO) 74 35 - 144 U/L   AST 15 10 - 35 U/L   ALT 10 9 - 46 U/L  CBC with Differential/Platelet  Result Value Ref Range   WBC 5.8 3.8 - 10.8 Thousand/uL   RBC 4.66 4.20 - 5.80 Million/uL   Hemoglobin 13.8 13.2 - 17.1 g/dL   HCT 42.5 38.5 - 50.0 %   MCV 91.2 80.0 - 100.0 fL   MCH 29.6 27.0 - 33.0 pg   MCHC 32.5 32.0 - 36.0 g/dL   RDW 13.9 11.0 - 15.0 %   Platelets 227 140 - 400 Thousand/uL   MPV 10.0 7.5 - 12.5 fL   Neutro Abs 3,532 1,500 - 7,800 cells/uL   Lymphs Abs 1,479 850 - 3,900 cells/uL   Absolute Monocytes 389 200 - 950 cells/uL   Eosinophils Absolute 360 15 - 500 cells/uL   Basophils Absolute 41 0 - 200 cells/uL   Neutrophils Relative % 60.9 %   Total Lymphocyte 25.5 %   Monocytes Relative 6.7 %   Eosinophils Relative 6.2 %   Basophils Relative 0.7 %  Hemoglobin A1c    Result Value Ref Range   Hgb A1c MFr Bld 5.6 <5.7 % of total Hgb   Mean Plasma Glucose 114 (calc)   eAG (  mmol/L) 6.3 (calc)  Cologuard  Result Value Ref Range   Cologuard Negative Negative      Assessment & Plan:   Problem List Items Addressed This Visit    Osteoarthritis of multiple joints    Episodic pain R knee medial, currently stable Chronic OA/DJD multiple joints knees, hands, feet, prior problems with shoulders. - Complicated by CKD limiting NSAID use - improved on max conservative therapy - Followed by [facility] Dr Martha ClanKrasinski - S/p last L knee steroid injection 06/21/17 only temporary relief  Plan: 1. Proceed with R knee steroid injection today - counseling provided, see procedure note, tolerated well. - Continue current plan Meloxicam 15mg  TWICE weekly q 3-4 days - Continue Tramadol 50mg  QID - reviewed [place] CSRS PMP AWARE - Continue Diclofenac topical gel 1-2 times daily - Continue inc dose Tylenol Follow-up as scheduled in 2021  He may consider return to [facility] Dr Martha ClanKrasinski to consider viscus injection series       Other Visit Diagnoses    Chronic pain of right knee    -  Primary   Relevant Medications   lidocaine (PF) (XYLOCAINE) 1 % injection 4 mL (Completed)   methylPREDNISolone acetate (DEPO-MEDROL) injection 40 mg (Completed)         Meds ordered this encounter  Medications   lidocaine (PF) (XYLOCAINE) 1 % injection 4 mL   methylPREDNISolone acetate (DEPO-MEDROL) injection 40 mg    Follow up plan: Return in about 3 months (around 10/31/2019), or if symptoms worsen or fail to improve, for knee pain.   Saralyn PilarAlexander Shan Valdes, DO [facility] 07/31/2019, 8:40 AM

## 2019-08-13 ENCOUNTER — Other Ambulatory Visit: Payer: Self-pay | Admitting: Family Medicine

## 2019-08-13 DIAGNOSIS — N400 Enlarged prostate without lower urinary tract symptoms: Secondary | ICD-10-CM

## 2019-08-20 ENCOUNTER — Other Ambulatory Visit: Payer: Self-pay | Admitting: Family Medicine

## 2019-08-20 DIAGNOSIS — G8929 Other chronic pain: Secondary | ICD-10-CM

## 2019-08-20 DIAGNOSIS — M159 Polyosteoarthritis, unspecified: Secondary | ICD-10-CM

## 2019-08-20 MED ORDER — TRAMADOL HCL 50 MG PO TABS: 50.0000 mg | ORAL_TABLET | Freq: Four times a day (QID) | ORAL | 2 refills | Status: DC

## 2019-10-16 ENCOUNTER — Other Ambulatory Visit: Payer: Self-pay | Admitting: Family Medicine

## 2019-10-16 DIAGNOSIS — M159 Polyosteoarthritis, unspecified: Secondary | ICD-10-CM

## 2019-10-20 DIAGNOSIS — M170 Bilateral primary osteoarthritis of knee: Secondary | ICD-10-CM | POA: Diagnosis not present

## 2019-10-23 DIAGNOSIS — Z961 Presence of intraocular lens: Secondary | ICD-10-CM | POA: Diagnosis not present

## 2019-10-23 DIAGNOSIS — Z0100 Encounter for examination of eyes and vision without abnormal findings: Secondary | ICD-10-CM | POA: Diagnosis not present

## 2019-10-26 DIAGNOSIS — I10 Essential (primary) hypertension: Secondary | ICD-10-CM

## 2019-10-27 MED ORDER — AMLODIPINE BESYLATE 5 MG PO TABS: 5.0000 mg | ORAL_TABLET | Freq: Every day | ORAL | 3 refills | Status: DC

## 2019-11-06 DIAGNOSIS — R600 Localized edema: Secondary | ICD-10-CM | POA: Diagnosis not present

## 2019-11-06 DIAGNOSIS — M25662 Stiffness of left knee, not elsewhere classified: Secondary | ICD-10-CM | POA: Diagnosis not present

## 2019-11-06 DIAGNOSIS — M25561 Pain in right knee: Secondary | ICD-10-CM | POA: Diagnosis not present

## 2019-11-06 DIAGNOSIS — M25661 Stiffness of right knee, not elsewhere classified: Secondary | ICD-10-CM | POA: Diagnosis not present

## 2019-11-06 DIAGNOSIS — M25562 Pain in left knee: Secondary | ICD-10-CM | POA: Diagnosis not present

## 2019-11-13 DIAGNOSIS — R600 Localized edema: Secondary | ICD-10-CM | POA: Diagnosis not present

## 2019-11-13 DIAGNOSIS — M25662 Stiffness of left knee, not elsewhere classified: Secondary | ICD-10-CM | POA: Diagnosis not present

## 2019-11-13 DIAGNOSIS — M25661 Stiffness of right knee, not elsewhere classified: Secondary | ICD-10-CM | POA: Diagnosis not present

## 2019-11-13 DIAGNOSIS — M25562 Pain in left knee: Secondary | ICD-10-CM | POA: Diagnosis not present

## 2019-11-13 DIAGNOSIS — M25561 Pain in right knee: Secondary | ICD-10-CM | POA: Diagnosis not present

## 2019-11-20 DIAGNOSIS — M25562 Pain in left knee: Secondary | ICD-10-CM | POA: Diagnosis not present

## 2019-11-20 DIAGNOSIS — M25661 Stiffness of right knee, not elsewhere classified: Secondary | ICD-10-CM | POA: Diagnosis not present

## 2019-11-20 DIAGNOSIS — R600 Localized edema: Secondary | ICD-10-CM | POA: Diagnosis not present

## 2019-11-20 DIAGNOSIS — M25662 Stiffness of left knee, not elsewhere classified: Secondary | ICD-10-CM | POA: Diagnosis not present

## 2019-11-20 DIAGNOSIS — M25561 Pain in right knee: Secondary | ICD-10-CM | POA: Diagnosis not present

## 2019-11-25 ENCOUNTER — Other Ambulatory Visit: Payer: Self-pay | Admitting: Family Medicine

## 2019-11-25 ENCOUNTER — Other Ambulatory Visit: Payer: Self-pay

## 2019-11-25 DIAGNOSIS — E7800 Pure hypercholesterolemia, unspecified: Secondary | ICD-10-CM

## 2019-11-25 DIAGNOSIS — M159 Polyosteoarthritis, unspecified: Secondary | ICD-10-CM

## 2019-11-25 DIAGNOSIS — G8929 Other chronic pain: Secondary | ICD-10-CM

## 2019-11-26 MED ORDER — TRAMADOL HCL 50 MG PO TABS: 50.0000 mg | ORAL_TABLET | Freq: Four times a day (QID) | ORAL | 2 refills | Status: DC

## 2019-11-27 DIAGNOSIS — M25662 Stiffness of left knee, not elsewhere classified: Secondary | ICD-10-CM | POA: Diagnosis not present

## 2019-11-27 DIAGNOSIS — M25561 Pain in right knee: Secondary | ICD-10-CM | POA: Diagnosis not present

## 2019-11-27 DIAGNOSIS — R600 Localized edema: Secondary | ICD-10-CM | POA: Diagnosis not present

## 2019-11-27 DIAGNOSIS — M25562 Pain in left knee: Secondary | ICD-10-CM | POA: Diagnosis not present

## 2019-11-27 DIAGNOSIS — M25661 Stiffness of right knee, not elsewhere classified: Secondary | ICD-10-CM | POA: Diagnosis not present

## 2019-11-28 ENCOUNTER — Ambulatory Visit: Payer: Self-pay

## 2019-12-02 DIAGNOSIS — M170 Bilateral primary osteoarthritis of knee: Secondary | ICD-10-CM | POA: Diagnosis not present

## 2019-12-09 DIAGNOSIS — M170 Bilateral primary osteoarthritis of knee: Secondary | ICD-10-CM | POA: Diagnosis not present

## 2019-12-16 DIAGNOSIS — M170 Bilateral primary osteoarthritis of knee: Secondary | ICD-10-CM | POA: Diagnosis not present

## 2019-12-18 ENCOUNTER — Other Ambulatory Visit: Payer: Self-pay

## 2019-12-18 ENCOUNTER — Encounter: Payer: Self-pay | Admitting: Family Medicine

## 2019-12-18 ENCOUNTER — Ambulatory Visit (INDEPENDENT_AMBULATORY_CARE_PROVIDER_SITE_OTHER): Payer: Medicare HMO | Admitting: Family Medicine

## 2019-12-18 ENCOUNTER — Other Ambulatory Visit: Payer: Self-pay | Admitting: Family Medicine

## 2019-12-18 VITALS — BP 122/54 | HR 64 | Temp 97.7°F | Resp 16 | Ht 70.0 in | Wt 156.0 lb

## 2019-12-18 DIAGNOSIS — G8929 Other chronic pain: Secondary | ICD-10-CM

## 2019-12-18 DIAGNOSIS — E7800 Pure hypercholesterolemia, unspecified: Secondary | ICD-10-CM

## 2019-12-18 DIAGNOSIS — N138 Other obstructive and reflux uropathy: Secondary | ICD-10-CM | POA: Diagnosis not present

## 2019-12-18 DIAGNOSIS — N401 Enlarged prostate with lower urinary tract symptoms: Secondary | ICD-10-CM

## 2019-12-18 DIAGNOSIS — N1831 Chronic kidney disease, stage 3a: Secondary | ICD-10-CM | POA: Diagnosis not present

## 2019-12-18 DIAGNOSIS — R7309 Other abnormal glucose: Secondary | ICD-10-CM

## 2019-12-18 DIAGNOSIS — I129 Hypertensive chronic kidney disease with stage 1 through stage 4 chronic kidney disease, or unspecified chronic kidney disease: Secondary | ICD-10-CM

## 2019-12-18 DIAGNOSIS — M8949 Other hypertrophic osteoarthropathy, multiple sites: Secondary | ICD-10-CM

## 2019-12-18 DIAGNOSIS — N183 Chronic kidney disease, stage 3 unspecified: Secondary | ICD-10-CM

## 2019-12-18 DIAGNOSIS — Z0000 Encounter for general adult medical examination without abnormal findings: Secondary | ICD-10-CM

## 2019-12-18 DIAGNOSIS — M159 Polyosteoarthritis, unspecified: Secondary | ICD-10-CM

## 2019-12-18 NOTE — Progress Notes (Signed)
Subjective:    Patient ID: Jerry Simmons, male    DOB: 1946-08-10, 74 y.o.   MRN: [id number]  Jerry Simmons is a 74 y.o. male presenting on 12/18/2019 for Hypertension   HPI   Osteoarthritis, Multiple joints (knees, hands, feet)/ Chronic Knee Pain Chronic problem with arthritis, last seen for same issue multiple times Followed by [facility] Dr Mack Guise, now completed PT since last visit 10/2019 and has started series of synvisc type injections completed #2 out of 3, pending 3rd dose. - Previous did steroid injections from me in past. Today some improvement in knee pain but still chronic issue. R>L worse. admits to occasional episodic pain in R knee medial aspect primarily, not a constant pain. He admits to a grinding feeling at times. On controlled subs agreement opoid forTramadol, doing well, taking QID rarely takes a half or skip a dose. - Taking Meloxicam 15mg  x 2 a week only, with some relief Diclofenac gel topical 1-2x times most days (hands, feet, knees), Tylenol Ext Str 500mg  2-3 times daily - previous knee injection L in 2018temporary relief - He is able to maintain daily function -Alsotaking Glucosamine 1500mg  and Chondroitin 1200mg  twice daily Denies redness swelling worsening pain, numbness tingling weakness other joint problem.  CHRONIC HTN CKD III Reports no new concerns. Recently had elevated BP was off med amlodipine self discontinued, not always checking BP regularly Current Meds - Amlodipine 5mg  daily   Reports good compliance, took meds today. Tolerating well, w/o complaints. Denies CP, dyspnea, HA, edema, dizziness / lightheadedness  Health Maintenance: UTD COVID19 vaccine.  Depression screen [facility] 2/9 12/18/2019 07/31/2019 06/19/2019  Decreased Interest 0 0 0  Down, Depressed, Hopeless 0 0 0  PHQ - 2 Score 0 0 0    Social History   Tobacco Use  . Smoking status: Former Smoker    Packs/day: 3.00    Years: 20.00    Pack years: 60.00    Types:  Cigarettes    Quit date: 09/16/1985    Years since quitting: 34.2  . Smokeless tobacco: Former Network engineer Use Topics  . Alcohol use: Yes    Alcohol/week: 1.0 standard drinks    Types: 1 Glasses of wine per week    Comment: Only has wine on occassional.   . Drug use: No    Review of Systems Per HPI unless specifically indicated above     Objective:    BP (!) 122/54   Pulse 64   Temp 97.7 F (36.5 C) (Temporal)   Resp 16   Ht 5\' 10"  (1.778 m)   Wt 156 lb (70.8 kg)   BMI 22.38 kg/m   Wt Readings from Last 3 Encounters:  12/18/19 156 lb (70.8 kg)  07/31/19 152 lb (68.9 kg)  06/19/19 148 lb (67.1 kg)    Physical Exam Vitals and nursing note reviewed.  Constitutional:      General: He is not in acute distress.    Appearance: He is well-developed. He is not diaphoretic.     Comments: Well-appearing, comfortable, cooperative  HENT:     Head: Normocephalic and atraumatic.  Eyes:     General:        Right eye: No discharge.        Left eye: No discharge.     Conjunctiva/sclera: Conjunctivae normal.  Cardiovascular:     Rate and Rhythm: Normal rate.  Pulmonary:     Effort: Pulmonary effort is normal.  Musculoskeletal:     Comments: Right Knee  Inspection: Slightly bulky appearance. No ecchymosis or effusion. Palpation: Non-tender today area of pain usually medial joint line. Improved amount of fine crepitus diffusely ROM: Full active ROM bilaterally Strength: 5/5 intact knee flex/ext, ankle dorsi/plantarflex Neurovascular: distally intact sensation light touch and pulses  Skin:    General: Skin is warm and dry.     Findings: No erythema or rash.  Neurological:     Mental Status: He is alert and oriented to person, place, and time.  Psychiatric:        Behavior: Behavior normal.     Comments: Well groomed, good eye contact, normal speech and thoughts       Results for orders placed or performed in visit on 06/12/19  PSA  Result Value Ref Range   PSA 1.4 <  OR = 4.0 ng/mL  Lipid panel  Result Value Ref Range   Cholesterol 196 <200 mg/dL   HDL 76 > OR = 40 mg/dL   Triglycerides 54 <774 mg/dL   LDL Cholesterol (Calc) 106 (H) mg/dL (calc)   Total CHOL/HDL Ratio 2.6 <5.0 (calc)   Non-HDL Cholesterol (Calc) 120 <130 mg/dL (calc)  COMPLETE METABOLIC PANEL WITH GFR  Result Value Ref Range   Glucose, Bld 85 65 - 99 mg/dL   BUN 30 (H) 7 - 25 mg/dL   Creat 1.28 (H) 7.86 - 1.18 mg/dL   GFR, Est Non African American 52 (L) > OR = 60 mL/min/1.75m2   GFR, Est African American 60 > OR = 60 mL/min/1.43m2   BUN/Creatinine Ratio 22 6 - 22 (calc)   Sodium 141 135 - 146 mmol/L   Potassium 4.2 3.5 - 5.3 mmol/L   Chloride 111 (H) 98 - 110 mmol/L   CO2 21 20 - 32 mmol/L   Calcium 9.2 8.6 - 10.3 mg/dL   Total Protein 6.5 6.1 - 8.1 g/dL   Albumin 4.1 3.6 - 5.1 g/dL   Globulin 2.4 1.9 - 3.7 g/dL (calc)   AG Ratio 1.7 1.0 - 2.5 (calc)   Total Bilirubin 0.5 0.2 - 1.2 mg/dL   Alkaline phosphatase (APISO) 74 35 - 144 U/L   AST 15 10 - 35 U/L   ALT 10 9 - 46 U/L  CBC with Differential/Platelet  Result Value Ref Range   WBC 5.8 3.8 - 10.8 Thousand/uL   RBC 4.66 4.20 - 5.80 Million/uL   Hemoglobin 13.8 13.2 - 17.1 g/dL   HCT 76.7 20.9 - 47.0 %   MCV 91.2 80.0 - 100.0 fL   MCH 29.6 27.0 - 33.0 pg   MCHC 32.5 32.0 - 36.0 g/dL   RDW 96.2 83.6 - 62.9 %   Platelets 227 140 - 400 Thousand/uL   MPV 10.0 7.5 - 12.5 fL   Neutro Abs 3,532 1,500 - 7,800 cells/uL   Lymphs Abs 1,479 850 - 3,900 cells/uL   Absolute Monocytes 389 200 - 950 cells/uL   Eosinophils Absolute 360 15 - 500 cells/uL   Basophils Absolute 41 0 - 200 cells/uL   Neutrophils Relative % 60.9 %   Total Lymphocyte 25.5 %   Monocytes Relative 6.7 %   Eosinophils Relative 6.2 %   Basophils Relative 0.7 %  Hemoglobin A1c  Result Value Ref Range   Hgb A1c MFr Bld 5.6 <5.7 % of total Hgb   Mean Plasma Glucose 114 (calc)   eAG (mmol/L) 6.3 (calc)  Cologuard  Result Value Ref Range   Cologuard  Negative Negative      Assessment & Plan:  Problem List Items Addressed This Visit    Osteoarthritis of multiple joints    Stable chronic issue, now working w/ ortho on synvisc injections 2 out of 3 so far R knee improvement  Episodic pain R knee medial, currently stable Chronic OA/DJD multiple joints knees, hands, feet, prior problems with shoulders. - Complicated by CKD limiting NSAID use - improved on max conservative therapy - Followed by [facility] Dr [name]  Plan: [facility] injection series - Continue current plan Meloxicam 15mg  TWICE weekly q 3-4 days - Continue Tramadol 50mg  QID - reviewed [place] CSRS PMP AWARE - Continue Diclofenac topical gel 1-2 times daily - Continue inc dose Tylenol Follow-up as scheduled in 2021      CKD (chronic kidney disease), stage III (HCC)    Stable from prior reading over few years. CKD III  likely secondary to HTN, also history of NSAID use - Cr baseline 1.15  To 1.3  Plan: Limit NSAID meloxicam Improve hydration May check  Cr trend q 6-12 months - will defer Nephrology for future if need      Chronic pain   BPH with obstruction/lower urinary tract symptoms    Chronic BPH, improved nocturia now Continues on finasteride  Off flomax       Benign hypertension with CKD (chronic kidney disease) stage III (HCC) - Primary    Controlled HTN previously, some low BP readings recently, asymptomatic - Home BP readings reviewed, limited sample Complication with CKD-III, fluctuation of Cr    Plan:  1. Continue current BP regimen Amlodipine 5mg  daily, Lisinopril 40mg  daily 2. Encourage improved lifestyle - low sodium diet, regular exercise 3. Continue monitor BP outside office, bring readings to next visit, if persistently >140/90 or new symptoms notify office sooner         No orders of the defined types were placed in this encounter.    Follow up plan: Return in about 6 months (around 06/19/2020) for Annual  Physical.  Future labs ordered for 06/24/20   , DO [facility] 12/18/2019, 2:47 PM

## 2019-12-18 NOTE — Assessment & Plan Note (Signed)
Stable chronic issue, now working w/ ortho on synvisc injections 2 out of 3 so far R knee improvement  Episodic pain R knee medial, currently stable Chronic OA/DJD multiple joints knees, hands, feet, prior problems with shoulders. - Complicated by CKD limiting NSAID use - improved on max conservative therapy - Followed by [facility] Dr [name]  Plan: [facility] injection series - Continue current plan Meloxicam 15mg  TWICE weekly q 3-4 days - Continue Tramadol 50mg  QID - reviewed [place] CSRS PMP AWARE - Continue Diclofenac topical gel 1-2 times daily - Continue inc dose Tylenol Follow-up as scheduled in 2021

## 2019-12-18 NOTE — Patient Instructions (Addendum)
Thank you for coming to the office today.  Keep on Tramadol  Plan for 3rd injection from Ortho as you are.  We will check labs including kidney sugar next time.  BP is well controlled today, continue med.  DUE for FASTING BLOOD WORK (no food or drink after midnight before the lab appointment, only water or coffee without cream/sugar on the morning of)  SCHEDULE "Lab Only" visit in the morning at the clinic for lab draw in 6 MONTHS   - Make sure Lab Only appointment is at about 1 week before your next appointment, so that results will be available  For Lab Results, once available within 2-3 days of blood draw, you can can log in to MyChart online to view your results and a brief explanation. Also, we can discuss results at next follow-up visit.   Please schedule a Follow-up Appointment to: Return in about 6 months (around 06/19/2020) for Annual Physical.  If you have any other questions or concerns, please feel free to call the office or send a message through MyChart. You may also schedule an earlier appointment if necessary.  Additionally, you may be receiving a survey about your experience at our office within a few days to 1 week by e-mail or mail. We value your feedback.  Saralyn Pilar, DO [facility], [place]

## 2019-12-18 NOTE — Assessment & Plan Note (Signed)
Chronic BPH, improved nocturia now Continues on finasteride  Off flomax

## 2019-12-18 NOTE — Assessment & Plan Note (Signed)
Controlled HTN previously, some low BP readings recently, asymptomatic - Home BP readings reviewed, limited sample Complication with CKD-III, fluctuation of Cr    Plan:  1. Continue current BP regimen Amlodipine 5mg  daily, Lisinopril 40mg  daily 2. Encourage improved lifestyle - low sodium diet, regular exercise 3. Continue monitor BP outside office, bring readings to next visit, if persistently >140/90 or new symptoms notify office sooner

## 2019-12-18 NOTE — Assessment & Plan Note (Signed)
Stable from prior reading over few years. CKD III  likely secondary to HTN, also history of NSAID use - Cr baseline 1.15  To 1.3  Plan: Limit NSAID meloxicam Improve hydration May check  Cr trend q 6-12 months - will defer Nephrology for future if need

## 2019-12-23 DIAGNOSIS — M170 Bilateral primary osteoarthritis of knee: Secondary | ICD-10-CM | POA: Diagnosis not present

## 2019-12-28 DIAGNOSIS — H35371 Puckering of macula, right eye: Secondary | ICD-10-CM | POA: Diagnosis not present

## 2020-02-25 ENCOUNTER — Other Ambulatory Visit: Payer: Self-pay | Admitting: Family Medicine

## 2020-02-25 DIAGNOSIS — G8929 Other chronic pain: Secondary | ICD-10-CM

## 2020-02-25 DIAGNOSIS — M159 Polyosteoarthritis, unspecified: Secondary | ICD-10-CM

## 2020-02-25 MED ORDER — TRAMADOL HCL 50 MG PO TABS: 50.0000 mg | ORAL_TABLET | Freq: Four times a day (QID) | ORAL | 2 refills | Status: DC

## 2020-02-25 NOTE — Telephone Encounter (Signed)
[facility] called for pt to request a refill of    traMADol (ULTRAM) 50 MG tablet 120 tabs  [facility] - [place], [place] - [address] Phone:  [phone]  Fax:  [phone]

## 2020-04-12 ENCOUNTER — Other Ambulatory Visit: Payer: Self-pay | Admitting: Family Medicine

## 2020-04-12 DIAGNOSIS — M159 Polyosteoarthritis, unspecified: Secondary | ICD-10-CM

## 2020-04-12 DIAGNOSIS — I10 Essential (primary) hypertension: Secondary | ICD-10-CM

## 2020-04-20 ENCOUNTER — Other Ambulatory Visit: Payer: Self-pay | Admitting: Family Medicine

## 2020-04-20 DIAGNOSIS — M159 Polyosteoarthritis, unspecified: Secondary | ICD-10-CM

## 2020-04-20 DIAGNOSIS — G8929 Other chronic pain: Secondary | ICD-10-CM

## 2020-04-20 MED ORDER — TRAMADOL HCL 50 MG PO TABS: 50.0000 mg | ORAL_TABLET | Freq: Four times a day (QID) | ORAL | 2 refills | Status: DC

## 2020-04-20 NOTE — Telephone Encounter (Signed)
Copied from CRM [id number]. Topic: Quick Communication - Rx Refill/Question >> Apr 20, 2020 10:14 AM Jaquita Rector A wrote: Medication: traMADol (ULTRAM) 50 MG tablet   Has the patient contacted their pharmacy? Yes.   (Agent: If no, request that the patient contact the pharmacy for the refill.) (Agent: If yes, when and what did the pharmacy advise?)  Preferred Pharmacy (with phone number or street name): [facility] - [place], [place] - [address]  Phone:  [phone] Fax:  [phone]     Agent: Please be advised that RX refills may take up to 3 business days. We ask that you follow-up with your pharmacy.

## 2020-04-20 NOTE — Telephone Encounter (Signed)
Requested medication (s) are due for refill today -too soon- should have RF  Requested medication (s) are on the active medication list -yes  Future visit scheduled -yes  Last refill: 1 month ago  Notes to clinic:Request for non delegated Rx  Requested Prescriptions  Pending Prescriptions Disp Refills   traMADol (ULTRAM) 50 MG tablet 120 tablet 2    Sig: Take 1 tablet (50 mg total) by mouth 4 (four) times daily.      Not Delegated - Analgesics:  Opioid Agonists Failed - 04/20/2020 10:17 AM      Failed - This refill cannot be delegated      Failed - Urine Drug Screen completed in last 360 days.      Passed - Valid encounter within last 6 months    Recent Outpatient Visits           4 months ago Benign hypertension with CKD (chronic kidney disease) stage III [facility]   [facility] [place], Netta Neat, DO   8 months ago Chronic pain of right knee   [facility] Smitty Cords, DO   10 months ago Annual physical exam   [facility] Smitty Cords, DO   1 year ago Elevated hemoglobin A1c   [facility] Smitty Cords, DO   1 year ago Annual physical exam   [facility] Smitty Cords, DO       Future Appointments             In 2 months Althea Charon, Netta Neat, DO [facility], [facility]                Requested Prescriptions  Pending Prescriptions Disp Refills   traMADol (ULTRAM) 50 MG tablet 120 tablet 2    Sig: Take 1 tablet (50 mg total) by mouth 4 (four) times daily.      Not Delegated - Analgesics:  Opioid Agonists Failed - 04/20/2020 10:17 AM      Failed - This refill cannot be delegated      Failed - Urine Drug Screen completed in last 360 days.      Passed - Valid encounter within last 6 months    Recent Outpatient Visits           4 months ago Benign hypertension with CKD (chronic kidney disease) stage III [facility]   [facility], Netta Neat, DO   8 months ago Chronic pain of right knee   [facility] Smitty Cords, DO   10 months ago Annual physical exam   [facility] Smitty Cords, DO   1 year ago Elevated hemoglobin A1c   [facility] Smitty Cords, DO   1 year ago Annual physical exam   [facility] Smitty Cords, DO       Future Appointments             In 2 months Althea Charon, Netta Neat, DO [facility], [employer]

## 2020-06-23 ENCOUNTER — Other Ambulatory Visit: Payer: Self-pay | Admitting: *Deleted

## 2020-06-23 DIAGNOSIS — N183 Chronic kidney disease, stage 3 unspecified: Secondary | ICD-10-CM

## 2020-06-23 DIAGNOSIS — Z0000 Encounter for general adult medical examination without abnormal findings: Secondary | ICD-10-CM

## 2020-06-23 DIAGNOSIS — E7800 Pure hypercholesterolemia, unspecified: Secondary | ICD-10-CM

## 2020-06-23 DIAGNOSIS — N1831 Chronic kidney disease, stage 3a: Secondary | ICD-10-CM

## 2020-06-23 DIAGNOSIS — R7309 Other abnormal glucose: Secondary | ICD-10-CM

## 2020-06-23 DIAGNOSIS — N401 Enlarged prostate with lower urinary tract symptoms: Secondary | ICD-10-CM

## 2020-06-24 ENCOUNTER — Other Ambulatory Visit: Payer: Self-pay

## 2020-06-24 ENCOUNTER — Other Ambulatory Visit: Payer: Medicare HMO

## 2020-06-24 DIAGNOSIS — N183 Chronic kidney disease, stage 3 unspecified: Secondary | ICD-10-CM | POA: Diagnosis not present

## 2020-06-24 DIAGNOSIS — E7800 Pure hypercholesterolemia, unspecified: Secondary | ICD-10-CM | POA: Diagnosis not present

## 2020-06-24 DIAGNOSIS — I129 Hypertensive chronic kidney disease with stage 1 through stage 4 chronic kidney disease, or unspecified chronic kidney disease: Secondary | ICD-10-CM | POA: Diagnosis not present

## 2020-06-24 DIAGNOSIS — R7309 Other abnormal glucose: Secondary | ICD-10-CM | POA: Diagnosis not present

## 2020-06-24 DIAGNOSIS — N1831 Chronic kidney disease, stage 3a: Secondary | ICD-10-CM | POA: Diagnosis not present

## 2020-06-24 DIAGNOSIS — N401 Enlarged prostate with lower urinary tract symptoms: Secondary | ICD-10-CM | POA: Diagnosis not present

## 2020-06-24 DIAGNOSIS — Z0000 Encounter for general adult medical examination without abnormal findings: Secondary | ICD-10-CM | POA: Diagnosis not present

## 2020-06-24 DIAGNOSIS — N138 Other obstructive and reflux uropathy: Secondary | ICD-10-CM | POA: Diagnosis not present

## 2020-06-25 LAB — CBC WITH DIFFERENTIAL/PLATELET
Absolute Monocytes: 393 cells/uL (ref 200–950)
Basophils Absolute: 40 cells/uL (ref 0–200)
Basophils Relative: 0.7 %
Eosinophils Absolute: 314 cells/uL (ref 15–500)
Eosinophils Relative: 5.5 %
HCT: 42.2 % (ref 38.5–50.0)
Hemoglobin: 13.8 g/dL (ref 13.2–17.1)
Lymphs Abs: 1351 cells/uL (ref 850–3900)
MCH: 30.1 pg (ref 27.0–33.0)
MCHC: 32.7 g/dL (ref 32.0–36.0)
MCV: 92.1 fL (ref 80.0–100.0)
MPV: 10.3 fL (ref 7.5–12.5)
Monocytes Relative: 6.9 %
Neutro Abs: 3602 cells/uL (ref 1500–7800)
Neutrophils Relative %: 63.2 %
Platelets: 221 10*3/uL (ref 140–400)
RBC: 4.58 10*6/uL (ref 4.20–5.80)
RDW: 13.2 % (ref 11.0–15.0)
Total Lymphocyte: 23.7 %
WBC: 5.7 10*3/uL (ref 3.8–10.8)

## 2020-06-25 LAB — COMPLETE METABOLIC PANEL WITH GFR
AG Ratio: 1.8 (calc) (ref 1.0–2.5)
ALT: 10 U/L (ref 9–46)
AST: 15 U/L (ref 10–35)
Albumin: 4.2 g/dL (ref 3.6–5.1)
Alkaline phosphatase (APISO): 75 U/L (ref 35–144)
BUN/Creatinine Ratio: 20 (calc) (ref 6–22)
BUN: 26 mg/dL — ABNORMAL HIGH (ref 7–25)
CO2: 24 mmol/L (ref 20–32)
Calcium: 9 mg/dL (ref 8.6–10.3)
Chloride: 106 mmol/L (ref 98–110)
Creat: 1.3 mg/dL — ABNORMAL HIGH (ref 0.70–1.18)
GFR, Est African American: 63 mL/min/{1.73_m2} (ref >=60)
GFR, Est Non African American: 54 mL/min/{1.73_m2} — ABNORMAL LOW (ref >=60)
Globulin: 2.4 g/dL (calc) (ref 1.9–3.7)
Glucose, Bld: 78 mg/dL (ref 65–99)
Potassium: 4.5 mmol/L (ref 3.5–5.3)
Sodium: 138 mmol/L (ref 135–146)
Total Bilirubin: 0.7 mg/dL (ref 0.2–1.2)
Total Protein: 6.6 g/dL (ref 6.1–8.1)

## 2020-06-25 LAB — HEMOGLOBIN A1C
Hgb A1c MFr Bld: 5.6 % of total Hgb (ref <5.7)
Mean Plasma Glucose: 114 (calc)
eAG (mmol/L): 6.3 (calc)

## 2020-06-25 LAB — LIPID PANEL
Cholesterol: 179 mg/dL (ref <200)
HDL: 74 mg/dL (ref >=40)
LDL Cholesterol (Calc): 92 mg/dL (calc)
Non-HDL Cholesterol (Calc): 105 mg/dL (calc) (ref <130)
Total CHOL/HDL Ratio: 2.4 (calc) (ref <5.0)
Triglycerides: 50 mg/dL (ref <150)

## 2020-06-25 LAB — PSA: PSA: 1.1 ng/mL (ref <=4.0)

## 2020-07-01 ENCOUNTER — Encounter: Payer: Self-pay | Admitting: Family Medicine

## 2020-07-01 ENCOUNTER — Ambulatory Visit (INDEPENDENT_AMBULATORY_CARE_PROVIDER_SITE_OTHER): Payer: Medicare HMO | Admitting: Family Medicine

## 2020-07-01 ENCOUNTER — Other Ambulatory Visit: Payer: Self-pay

## 2020-07-01 VITALS — BP 101/51 | HR 61 | Temp 97.7°F | Resp 16 | Ht 70.0 in | Wt 155.0 lb

## 2020-07-01 DIAGNOSIS — Z23 Encounter for immunization: Secondary | ICD-10-CM

## 2020-07-01 DIAGNOSIS — N401 Enlarged prostate with lower urinary tract symptoms: Secondary | ICD-10-CM | POA: Diagnosis not present

## 2020-07-01 DIAGNOSIS — N138 Other obstructive and reflux uropathy: Secondary | ICD-10-CM | POA: Diagnosis not present

## 2020-07-01 DIAGNOSIS — I129 Hypertensive chronic kidney disease with stage 1 through stage 4 chronic kidney disease, or unspecified chronic kidney disease: Secondary | ICD-10-CM

## 2020-07-01 DIAGNOSIS — Z0000 Encounter for general adult medical examination without abnormal findings: Secondary | ICD-10-CM | POA: Diagnosis not present

## 2020-07-01 DIAGNOSIS — N183 Chronic kidney disease, stage 3 unspecified: Secondary | ICD-10-CM

## 2020-07-01 MED ORDER — FINASTERIDE 5 MG PO TABS: 5.0000 mg | ORAL_TABLET | Freq: Every day | ORAL | 3 refills | Status: DC

## 2020-07-01 MED ORDER — LISINOPRIL 40 MG PO TABS: 40.0000 mg | ORAL_TABLET | Freq: Every day | ORAL | 3 refills | Status: DC

## 2020-07-01 NOTE — Patient Instructions (Addendum)
Thank you for coming to the office today.  Keep up the great work overall.  Remember Finasteride lowers PSA number by about half. Yours is in 2 range. We will keep track  Kidneys slightly improved  Next cologuard or colon test in 2023.  Please schedule a Follow-up Appointment to: Return in about 6 months (around 12/29/2020) for 6 month follow-up Arthritis, HTN, CKD rx refill.  If you have any other questions or concerns, please feel free to call the office or send a message through MyChart. You may also schedule an earlier appointment if necessary.  Additionally, you may be receiving a survey about your experience at our office within a few days to 1 week by e-mail or mail. We value your feedback.  Saralyn Pilar, DO [facility], [place]

## 2020-07-01 NOTE — Progress Notes (Signed)
Subjective:    Patient ID: Jerry Simmons, male    DOB: 1946-01-24, 74 y.o.   MRN: [id number]  Jerry Simmons is a 73 y.o. male presenting on 07/01/2020 for Annual Exam   HPI   Here for Annual Physical and lab Review.  Elevated A1c A1c improved to 5.6. Stable from previous. - Not taking medicine - Lifestyle: - Weight down10 lbs in 6 months - Diet: Improving diet, less sweets, more water - Exercise: active, walking  Osteoarthritis, Multiple joints (knees, hands, feet)/ Chronic Knee Pain Chronic problem with arthritis, last seen for same issue multiple times Followed by [facility] Dr [name] Failed steroid injections Completed PT and Synvisc injections He will redo visc injections next year if indicated, he will check with them Today some improvement in knee pain but still chronic issue. R>L worse. admits to occasional episodic pain in R knee medial aspect primarily, not a constant pain. He admits to a grinding feeling at times. On controlled subs agreement opoid forTramadol, doing well, taking QID rarely takes a half or skip a dose. - Taking Meloxicam 15mg  x 2 a week only, with some relief Diclofenac gel topical 1-2x times most days (hands, feet, knees), Tylenol Ext Str 500mg  2-3 times daily - He is able to maintain daily function -Alsotaking Glucosamine 1500mg  and Chondroitin 1200mg  twice daily Denies redness swelling worsening pain, numbness tingling weakness other joint problem.  CHRONIC HTN CKD III Reports no new concerns. Recently had elevated BP in AM at times but not significantly high or a problem. Current Meds - Amlodipine 5mg  daily, Lisinopril 40mg  daily Reports good compliance, took meds today. Tolerating well, w/o complaints. Denies CP, dyspnea, HA, edema, dizziness / lightheadedness  BPH Nocturia / LUTS He has done very well off Tamsulosin. He continues monotherapy Finasteride 5mg  nightly. Rarely has any nocturia now. He used to see [facility]  Dr in past, has not returned.  HYPERLIPIDEMIA: - Reports no concerns. Last lipid 06/2020,improved controlled On Lovastatin 20mg  daily, tolerating w/o side effect  Health Maintenance: - UTD PNA vaccines. - UTD Shingrix - UTD Hep C routine screen - Due flu vaccine, to be given today  PSA Prostate Cancer Screening - 1.1, negative. Note he is on 5 alpha reductase finasteride, has shown lower PSA from mid 2.5 range down to 1.1 now over few years.  - Last colonoscopy 2009,was told 10 years to repeat, had no polyps - UTD Cologuard 06/26/2019 negative, good for 3 years, 06/2022  UTD COVID19 vaccine.   Depression screen [facility] 2/9 07/01/2020 12/18/2019 07/31/2019  Decreased Interest 0 0 0  Down, Depressed, Hopeless 0 0 0  PHQ - 2 Score 0 0 0    Past Medical History:  Diagnosis Date  . Arthritis    Past Surgical History:  Procedure Laterality Date  . EYE SURGERY  12/10/2018   Right eye   Social History   Socioeconomic History  . Marital status: Married    Spouse name: Not on file  . Number of children: Not on file  . Years of education: Not on file  . Highest education level: Not on file  Occupational History  . Not on file  Tobacco Use  . Smoking status: Former Smoker    Packs/day: 3.00    Years: 20.00    Pack years: 60.00    Types: Cigarettes    Quit date: 09/16/1985    Years since quitting: 34.8  . Smokeless tobacco: Former 07/2022 and Sexual Activity  .  Alcohol use: Yes    Alcohol/week: 1.0 standard drink    Types: 1 Glasses of wine per week    Comment: Only has wine on occassional.   . Drug use: No  . Sexual activity: Not on file  Other Topics Concern  . Not on file  Social History Narrative  . Not on file   Social Determinants of Health   Financial Resource Strain:   . Difficulty of Paying Living Expenses: Not on file  Food Insecurity:   . Worried About Programme researcher, broadcasting/film/video in the Last Year: Not on file  . Ran Out of Food in the  Last Year: Not on file  Transportation Needs:   . Lack of Transportation (Medical): Not on file  . Lack of Transportation (Non-Medical): Not on file  Physical Activity:   . Days of Exercise per Week: Not on file  . Minutes of Exercise per Session: Not on file  Stress:   . Feeling of Stress : Not on file  Social Connections:   . Frequency of Communication with Friends and Family: Not on file  . Frequency of Social Gatherings with Friends and Family: Not on file  . Attends Religious Services: Not on file  . Active Member of Clubs or Organizations: Not on file  . Attends Banker Meetings: Not on file  . Marital Status: Not on file  Intimate Partner Violence:   . Fear of Current or Ex-Partner: Not on file  . Emotionally Abused: Not on file  . Physically Abused: Not on file  . Sexually Abused: Not on file   No family history on file. Current Outpatient Medications on File Prior to Visit  Medication Sig  . amLODipine (NORVASC) 5 MG tablet Take 1 tablet (5 mg total) by mouth daily.  . diclofenac sodium (VOLTAREN) 1 % GEL Apply 2 g topically 3 (three) times daily as needed. For knees and hands, arthritis  . Glucosamine-Chondroit-Vit C-Mn (GLUCOSAMINE 1500 COMPLEX PO) Take 1,500 mg 2 (two) times daily by mouth.  . lovastatin (MEVACOR) 20 MG tablet TAKE 1 TABLET EVERY DAY  . meloxicam (MOBIC) 15 MG tablet TAKE 1 TABLET EVERY OTHER DAY  (TAKE ON A RARE BASIS ONLY)  . traMADol (ULTRAM) 50 MG tablet Take 1 tablet (50 mg total) by mouth 4 (four) times daily.   No current facility-administered medications on file prior to visit.    Review of Systems  Constitutional: Negative for activity change, appetite change, chills, diaphoresis, fatigue and fever.  HENT: Negative for congestion and hearing loss.   Eyes: Negative for visual disturbance.  Respiratory: Negative for cough, chest tightness, shortness of breath and wheezing.   Cardiovascular: Negative for chest pain, palpitations  and leg swelling.  Gastrointestinal: Negative for abdominal pain, constipation, diarrhea, nausea and vomiting.  Genitourinary: Negative for dysuria, frequency and hematuria.  Musculoskeletal: Negative for arthralgias and neck pain.  Skin: Negative for rash.  Allergic/Immunologic: Negative for environmental allergies.  Neurological: Negative for dizziness, weakness, light-headedness, numbness and headaches.  Hematological: Negative for adenopathy.  Psychiatric/Behavioral: Negative for behavioral problems, dysphoric mood and sleep disturbance.   Per HPI unless specifically indicated above      Objective:    BP (!) 101/51   Pulse 61   Temp 97.7 F (36.5 C) (Temporal)   Resp 16   Ht 5\' 10"  (1.778 m)   Wt 155 lb (70.3 kg)   SpO2 96%   BMI 22.24 kg/m   Wt Readings from Last 3 Encounters:  07/01/20 155 lb (70.3 kg)  12/18/19 156 lb (70.8 kg)  07/31/19 152 lb (68.9 kg)    Physical Exam Vitals and nursing note reviewed.  Constitutional:      General: He is not in acute distress.    Appearance: He is well-developed. He is not diaphoretic.     Comments: Well-appearing, comfortable, cooperative  HENT:     Head: Normocephalic and atraumatic.  Eyes:     General:        Right eye: No discharge.        Left eye: No discharge.     Conjunctiva/sclera: Conjunctivae normal.     Pupils: Pupils are equal, round, and reactive to light.  Neck:     Thyroid: No thyromegaly.  Cardiovascular:     Rate and Rhythm: Normal rate and regular rhythm.     Heart sounds: Normal heart sounds. No murmur heard.   Pulmonary:     Effort: Pulmonary effort is normal. No respiratory distress.     Breath sounds: Normal breath sounds. No wheezing or rales.  Abdominal:     General: Bowel sounds are normal. There is no distension.     Palpations: Abdomen is soft. There is no mass.     Tenderness: There is no abdominal tenderness.  Musculoskeletal:        General: No tenderness. Normal range of motion.      Cervical back: Normal range of motion and neck supple.     Comments: Upper / Lower Extremities: - Normal muscle tone, strength bilateral upper extremities 5/5, lower extremities 5/5  Bilateral knees with bulky appearance, crepitus  Lymphadenopathy:     Cervical: No cervical adenopathy.  Skin:    General: Skin is warm and dry.     Findings: No erythema or rash.  Neurological:     Mental Status: He is alert and oriented to person, place, and time.     Comments: Distal sensation intact to light touch all extremities  Psychiatric:        Behavior: Behavior normal.     Comments: Well groomed, good eye contact, normal speech and thoughts       Results for orders placed or performed in visit on 06/23/20  PSA  Result Value Ref Range   PSA 1.1 < OR = 4.0 ng/mL  Lipid panel  Result Value Ref Range   Cholesterol 179 <200 mg/dL   HDL 74 > OR = 40 mg/dL   Triglycerides 50 <454 mg/dL   LDL Cholesterol (Calc) 92 mg/dL (calc)   Total CHOL/HDL Ratio 2.4 <5.0 (calc)   Non-HDL Cholesterol (Calc) 105 <130 mg/dL (calc)  COMPLETE METABOLIC PANEL WITH GFR  Result Value Ref Range   Glucose, Bld 78 65 - 99 mg/dL   BUN 26 (H) 7 - 25 mg/dL   Creat 0.98 (H) 1.19 - 1.18 mg/dL   GFR, Est Non African American 54 (L) > OR = 60 mL/min/1.21m2   GFR, Est African American 63 > OR = 60 mL/min/1.8m2   BUN/Creatinine Ratio 20 6 - 22 (calc)   Sodium 138 135 - 146 mmol/L   Potassium 4.5 3.5 - 5.3 mmol/L   Chloride 106 98 - 110 mmol/L   CO2 24 20 - 32 mmol/L   Calcium 9.0 8.6 - 10.3 mg/dL   Total Protein 6.6 6.1 - 8.1 g/dL   Albumin 4.2 3.6 - 5.1 g/dL   Globulin 2.4 1.9 - 3.7 g/dL (calc)   AG Ratio 1.8 1.0 - 2.5 (calc)  Total Bilirubin 0.7 0.2 - 1.2 mg/dL   Alkaline phosphatase (APISO) 75 35 - 144 U/L   AST 15 10 - 35 U/L   ALT 10 9 - 46 U/L  CBC with Differential/Platelet  Result Value Ref Range   WBC 5.7 3.8 - 10.8 Thousand/uL   RBC 4.58 4.20 - 5.80 Million/uL   Hemoglobin 13.8 13.2 - 17.1 g/dL    HCT 40.942.2 38 - 50 %   MCV 92.1 80.0 - 100.0 fL   MCH 30.1 27.0 - 33.0 pg   MCHC 32.7 32.0 - 36.0 g/dL   RDW 81.113.2 91.411.0 - 78.215.0 %   Platelets 221 140 - 400 Thousand/uL   MPV 10.3 7.5 - 12.5 fL   Neutro Abs 3,602 1,500 - 7,800 cells/uL   Lymphs Abs 1,351 850 - 3,900 cells/uL   Absolute Monocytes 393 200 - 950 cells/uL   Eosinophils Absolute 314 15 - 500 cells/uL   Basophils Absolute 40 0 - 200 cells/uL   Neutrophils Relative % 63.2 %   Total Lymphocyte 23.7 %   Monocytes Relative 6.9 %   Eosinophils Relative 5.5 %   Basophils Relative 0.7 %  Hemoglobin A1c  Result Value Ref Range   Hgb A1c MFr Bld 5.6 <5.7 % of total Hgb   Mean Plasma Glucose 114 (calc)   eAG (mmol/L) 6.3 (calc)      Assessment & Plan:   Problem List Items Addressed This Visit    BPH with obstruction/lower urinary tract symptoms    Chronic BPH, improved nocturia now Continues on finasteride  Off flomax PSA stable 1 range, considering finasteride would consider it x 2       Relevant Medications   finasteride (PROSCAR) 5 MG tablet   Benign hypertension with CKD (chronic kidney disease) stage III (HCC)    Controlled HTN Reviewed outside readings Complication with CKD-III, fluctuation of Cr    Plan:  1. Continue current BP regimen Amlodipine 5mg  daily, Lisinopril 40mg  daily 2. Encourage improved lifestyle - low sodium diet, regular exercise 3. Continue monitor BP outside office, bring readings to next visit, if persistently >140/90 or new symptoms notify office sooner      Relevant Medications   lisinopril (ZESTRIL) 40 MG tablet    Other Visit Diagnoses    Annual physical exam    -  Primary   Needs flu shot       Relevant Orders   Flu Vaccine QUAD High Dose(Fluad) (Completed)      Updated Health Maintenance information - Flu shot today Next cologuard 2023 Reviewed recent lab results with patient Encouraged improvement to lifestyle with diet and exercise - Goal of weight loss   Meds ordered  this encounter  Medications  . lisinopril (ZESTRIL) 40 MG tablet    Sig: Take 1 tablet (40 mg total) by mouth daily.    Dispense:  90 tablet    Refill:  3  . finasteride (PROSCAR) 5 MG tablet    Sig: Take 1 tablet (5 mg total) by mouth daily.    Dispense:  90 tablet    Refill:  3      Follow up plan: Return in about 6 months (around 12/29/2020) for 6 month follow-up Arthritis, HTN, CKD rx refill.  Saralyn PilarAlexander Ani Deoliveira, DO [facility] 07/01/2020, 2:37 PM

## 2020-07-02 NOTE — Assessment & Plan Note (Signed)
Controlled HTN Reviewed outside readings Complication with CKD-III, fluctuation of Cr    Plan:  1. Continue current BP regimen Amlodipine 5mg  daily, Lisinopril 40mg  daily 2. Encourage improved lifestyle - low sodium diet, regular exercise 3. Continue monitor BP outside office, bring readings to next visit, if persistently >140/90 or new symptoms notify office sooner

## 2020-07-02 NOTE — Assessment & Plan Note (Addendum)
Chronic BPH, improved nocturia now Continues on finasteride  Off flomax PSA stable 1 range, considering finasteride would consider it x 2

## 2020-07-28 ENCOUNTER — Other Ambulatory Visit: Payer: Self-pay

## 2020-07-28 DIAGNOSIS — G8929 Other chronic pain: Secondary | ICD-10-CM

## 2020-07-28 DIAGNOSIS — M159 Polyosteoarthritis, unspecified: Secondary | ICD-10-CM

## 2020-07-28 MED ORDER — TRAMADOL HCL 50 MG PO TABS: 50.0000 mg | ORAL_TABLET | Freq: Four times a day (QID) | ORAL | 2 refills | Status: DC

## 2020-08-04 ENCOUNTER — Other Ambulatory Visit: Payer: Self-pay | Admitting: Family Medicine

## 2020-08-04 DIAGNOSIS — I10 Essential (primary) hypertension: Secondary | ICD-10-CM

## 2020-08-15 ENCOUNTER — Other Ambulatory Visit: Payer: Self-pay

## 2020-08-15 DIAGNOSIS — M8949 Other hypertrophic osteoarthropathy, multiple sites: Secondary | ICD-10-CM

## 2020-08-15 DIAGNOSIS — M159 Polyosteoarthritis, unspecified: Secondary | ICD-10-CM

## 2020-08-15 DIAGNOSIS — G8929 Other chronic pain: Secondary | ICD-10-CM

## 2020-08-17 MED ORDER — TRAMADOL HCL 50 MG PO TABS: 50.0000 mg | ORAL_TABLET | Freq: Four times a day (QID) | ORAL | 2 refills | Status: DC

## 2020-09-09 ENCOUNTER — Other Ambulatory Visit: Payer: Self-pay | Admitting: Family Medicine

## 2020-09-09 DIAGNOSIS — E7800 Pure hypercholesterolemia, unspecified: Secondary | ICD-10-CM

## 2020-11-14 ENCOUNTER — Other Ambulatory Visit: Payer: Self-pay | Admitting: Family Medicine

## 2020-11-14 DIAGNOSIS — I10 Essential (primary) hypertension: Secondary | ICD-10-CM

## 2020-11-15 NOTE — Telephone Encounter (Signed)
Requested Prescriptions  Pending Prescriptions Disp Refills  . amLODipine (NORVASC) 5 MG tablet [Pharmacy Med Name: AMLODIPINE BESYLATE 5 MG Tablet] 90 tablet 0    Sig: TAKE 1 TABLET EVERY DAY     Cardiovascular:  Calcium Channel Blockers Passed - 11/14/2020  7:24 PM      Passed - Last BP in normal range    BP Readings from Last 1 Encounters:  07/01/20 (!) 101/51         Passed - Valid encounter within last 6 months    Recent Outpatient Visits          4 months ago Annual physical exam   [facility] Smitty Cords, DO   11 months ago Benign hypertension with CKD (chronic kidney disease) stage III [facility])   [facility] Smitty Cords, DO   1 year ago Chronic pain of right knee   [facility] Smitty Cords, DO   1 year ago Annual physical exam   [facility] Smitty Cords, DO   1 year ago Elevated hemoglobin A1c   [facility] Althea Charon, Netta Neat, DO      Future Appointments            In 1 month Althea Charon, Netta Neat, DO [facility]

## 2020-11-18 ENCOUNTER — Other Ambulatory Visit: Payer: Self-pay

## 2020-11-18 DIAGNOSIS — M8949 Other hypertrophic osteoarthropathy, multiple sites: Secondary | ICD-10-CM

## 2020-11-18 DIAGNOSIS — G8929 Other chronic pain: Secondary | ICD-10-CM

## 2020-11-18 DIAGNOSIS — M159 Polyosteoarthritis, unspecified: Secondary | ICD-10-CM

## 2020-11-18 MED ORDER — TRAMADOL HCL 50 MG PO TABS: 50.0000 mg | ORAL_TABLET | Freq: Four times a day (QID) | ORAL | 2 refills | Status: DC

## 2020-12-23 DIAGNOSIS — M170 Bilateral primary osteoarthritis of knee: Secondary | ICD-10-CM | POA: Diagnosis not present

## 2020-12-30 ENCOUNTER — Ambulatory Visit: Payer: Medicare HMO | Admitting: Family Medicine

## 2020-12-30 DIAGNOSIS — M170 Bilateral primary osteoarthritis of knee: Secondary | ICD-10-CM | POA: Diagnosis not present

## 2021-01-06 ENCOUNTER — Ambulatory Visit (INDEPENDENT_AMBULATORY_CARE_PROVIDER_SITE_OTHER): Payer: Medicare HMO | Admitting: Family Medicine

## 2021-01-06 ENCOUNTER — Other Ambulatory Visit: Payer: Self-pay

## 2021-01-06 ENCOUNTER — Other Ambulatory Visit: Payer: Self-pay | Admitting: Family Medicine

## 2021-01-06 ENCOUNTER — Encounter: Payer: Self-pay | Admitting: Family Medicine

## 2021-01-06 VITALS — BP 130/61 | HR 55 | Ht 70.0 in | Wt 159.6 lb

## 2021-01-06 DIAGNOSIS — I129 Hypertensive chronic kidney disease with stage 1 through stage 4 chronic kidney disease, or unspecified chronic kidney disease: Secondary | ICD-10-CM

## 2021-01-06 DIAGNOSIS — M170 Bilateral primary osteoarthritis of knee: Secondary | ICD-10-CM | POA: Diagnosis not present

## 2021-01-06 DIAGNOSIS — M8949 Other hypertrophic osteoarthropathy, multiple sites: Secondary | ICD-10-CM

## 2021-01-06 DIAGNOSIS — N183 Chronic kidney disease, stage 3 unspecified: Secondary | ICD-10-CM

## 2021-01-06 DIAGNOSIS — E7800 Pure hypercholesterolemia, unspecified: Secondary | ICD-10-CM

## 2021-01-06 DIAGNOSIS — M159 Polyosteoarthritis, unspecified: Secondary | ICD-10-CM

## 2021-01-06 DIAGNOSIS — N138 Other obstructive and reflux uropathy: Secondary | ICD-10-CM

## 2021-01-06 DIAGNOSIS — N1831 Chronic kidney disease, stage 3a: Secondary | ICD-10-CM | POA: Diagnosis not present

## 2021-01-06 DIAGNOSIS — N401 Enlarged prostate with lower urinary tract symptoms: Secondary | ICD-10-CM

## 2021-01-06 DIAGNOSIS — Z0000 Encounter for general adult medical examination without abnormal findings: Secondary | ICD-10-CM

## 2021-01-06 DIAGNOSIS — R7309 Other abnormal glucose: Secondary | ICD-10-CM

## 2021-01-06 MED ORDER — LOVASTATIN 20 MG PO TABS: 20.0000 mg | ORAL_TABLET | Freq: Every day | ORAL | 3 refills | Status: DC

## 2021-01-06 MED ORDER — AMLODIPINE BESYLATE 5 MG PO TABS: 5.0000 mg | ORAL_TABLET | Freq: Every day | ORAL | 3 refills | Status: DC

## 2021-01-06 MED ORDER — MELOXICAM 7.5 MG PO TABS: 7.5000 mg | ORAL_TABLET | Freq: Every day | ORAL | 3 refills | Status: DC | PRN

## 2021-01-06 NOTE — Assessment & Plan Note (Signed)
Stable from prior reading over few years. CKD III  likely secondary to HTN, also history of NSAID use - Cr baseline 1.15  To 1.3  Plan: Limit NSAID meloxicam Improve hydration Recheck Cr in 6 months

## 2021-01-06 NOTE — Progress Notes (Signed)
Subjective:    Patient ID: Jerry Simmons, male    DOB: 01-Feb-1946, 75 y.o.   MRN: [id number]  Jerry Simmons is a 75 y.o. male presenting on 01/06/2021 for Hypertension, Chronic Kidney Disease, and Arthritis   HPI  Elevated A1c A1c improved to 5.6. Stable from previous. - Not taking medicine - Lifestyle: Weight gain mild since last visit - Diet:Improving diet, less sweets, more water - Exercise: active, walking - mowing yards 4 x week  Osteoarthritis, Multiple joints (knees, hands, feet)/ Chronic Knee Pain Chronic problem with arthritis, last seen for same issue multiple times Followed by [facility] Dr [name] Failed steroid injections Completed PT and Synvisc injections  Today some improvement in knee pain but still chronic issue. R>L worse.admits to occasional episodic pain in R knee medial aspect primarily, not a constant pain. He admits to a grinding feeling at times. Continues to work with Dr [name] - improved on injections On controlled subs agreement opoid forTramadol, doing well, taking QID rarely takes a half or skip a dose. - Taking Meloxicam 15mg  x 2-4 x a week but may need more frequent at times. Diclofenac gel topical 1-2x times most days (hands, feet, knees), Tylenol Ext Str 500mg  2-3 times daily - He is able to maintain daily function -Alsotaking Glucosamine 1500mg  and Chondroitin 1200mg  twice daily Denies redness swelling worsening pain, numbness tingling weakness other joint problem.  CHRONIC HTNCKD III Reportsno new concerns Current Meds -Amlodipine 5mg  daily, Lisinopril 40mg  daily Reports good compliance, took meds today. Tolerating well, w/o complaints. Denies CP, dyspnea, HA, edema, dizziness / lightheadedness  HYPERLIPIDEMIA: - Reports no concerns. Last lipid9/2021,improved controlled On Lovastatin 20mg  daily, tolerating w/o side effect  Health Maintenance:  UTD COVID19 vaccine.  Depression screen [facility] 2/9 07/01/2020 12/18/2019  07/31/2019  Decreased Interest 0 0 0  Down, Depressed, Hopeless 0 0 0  PHQ - 2 Score 0 0 0    Social History   Tobacco Use  . Smoking status: Former Smoker    Packs/day: 3.00    Years: 20.00    Pack years: 60.00    Types: Cigarettes    Quit date: 09/16/1985    Years since quitting: 35.3  . Smokeless tobacco: Former Use Topics  . Alcohol use: Yes    Alcohol/week: 1.0 standard drink    Types: 1 Glasses of wine per week    Comment: Only has wine on occassional.   . Drug use: No    Review of Systems Per HPI unless specifically indicated above     Objective:    BP 130/61   Pulse (!) 55   Ht 5\' 10"  (1.778 m)   Wt 159 lb 9.6 oz (72.4 kg)   SpO2 99%   BMI 22.90 kg/m   Wt Readings from Last 3 Encounters:  01/06/21 159 lb 9.6 oz (72.4 kg)  07/01/20 155 lb (70.3 kg)  12/18/19 156 lb (70.8 kg)    Physical Exam Vitals and nursing note reviewed.  Constitutional:      General: He is not in acute distress.    Appearance: He is well-developed. He is not diaphoretic.     Comments: Well-appearing, comfortable, cooperative  HENT:     Head: Normocephalic and atraumatic.  Eyes:     General:        Right eye: No discharge.        Left eye: No discharge.     Conjunctiva/sclera: Conjunctivae normal.  Cardiovascular:     Rate and Rhythm:  Normal rate.  Pulmonary:     Effort: Pulmonary effort is normal.  Skin:    General: Skin is warm and dry.     Findings: No erythema or rash.  Neurological:     Mental Status: He is alert and oriented to person, place, and time.  Psychiatric:        Behavior: Behavior normal.     Comments: Well groomed, good eye contact, normal speech and thoughts       Results for orders placed or performed in visit on 06/23/20  PSA  Result Value Ref Range   PSA 1.1 < OR = 4.0 ng/mL  Lipid panel  Result Value Ref Range   Cholesterol 179 <200 mg/dL   HDL 74 > OR = 40 mg/dL   Triglycerides 50 <440 mg/dL   LDL Cholesterol (Calc) 92  mg/dL (calc)   Total CHOL/HDL Ratio 2.4 <5.0 (calc)   Non-HDL Cholesterol (Calc) 105 <130 mg/dL (calc)  COMPLETE METABOLIC PANEL WITH GFR  Result Value Ref Range   Glucose, Bld 78 65 - 99 mg/dL   BUN 26 (H) 7 - 25 mg/dL   Creat 3.47 (H) 4.25 - 1.18 mg/dL   GFR, Est Non African American 54 (L) > OR = 60 mL/min/1.34m2   GFR, Est African American 63 > OR = 60 mL/min/1.68m2   BUN/Creatinine Ratio 20 6 - 22 (calc)   Sodium 138 135 - 146 mmol/L   Potassium 4.5 3.5 - 5.3 mmol/L   Chloride 106 98 - 110 mmol/L   CO2 24 20 - 32 mmol/L   Calcium 9.0 8.6 - 10.3 mg/dL   Total Protein 6.6 6.1 - 8.1 g/dL   Albumin 4.2 3.6 - 5.1 g/dL   Globulin 2.4 1.9 - 3.7 g/dL (calc)   AG Ratio 1.8 1.0 - 2.5 (calc)   Total Bilirubin 0.7 0.2 - 1.2 mg/dL   Alkaline phosphatase (APISO) 75 35 - 144 U/L   AST 15 10 - 35 U/L   ALT 10 9 - 46 U/L  CBC with Differential/Platelet  Result Value Ref Range   WBC 5.7 3.8 - 10.8 Thousand/uL   RBC 4.58 4.20 - 5.80 Million/uL   Hemoglobin 13.8 13.2 - 17.1 g/dL   HCT 95.6 38.7 - 56.4 %   MCV 92.1 80.0 - 100.0 fL   MCH 30.1 27.0 - 33.0 pg   MCHC 32.7 32.0 - 36.0 g/dL   RDW 33.2 95.1 - 88.4 %   Platelets 221 140 - 400 Thousand/uL   MPV 10.3 7.5 - 12.5 fL   Neutro Abs 3,602 1,500 - 7,800 cells/uL   Lymphs Abs 1,351 850 - 3,900 cells/uL   Absolute Monocytes 393 200 - 950 cells/uL   Eosinophils Absolute 314 15 - 500 cells/uL   Basophils Absolute 40 0 - 200 cells/uL   Neutrophils Relative % 63.2 %   Total Lymphocyte 23.7 %   Monocytes Relative 6.9 %   Eosinophils Relative 5.5 %   Basophils Relative 0.7 %  Hemoglobin A1c  Result Value Ref Range   Hgb A1c MFr Bld 5.6 <5.7 % of total Hgb   Mean Plasma Glucose 114 (calc)   eAG (mmol/L) 6.3 (calc)      Assessment & Plan:   Problem List Items Addressed This Visit    Osteoarthritis of multiple joints    Stable chronic issue - improved on injections per ortho Episodic pain R knee medial, currently stable Chronic OA/DJD  multiple joints knees, hands, feet, prior problems with shoulders. -  Complicated by CKD limiting NSAID use - improved on max conservative therapy - Followed by [facility] Dr [name]  Plan: [facility] injection series - Continue current plan Meloxicam 7.5mg  3-4 times a week can adjust as needed, ordered for daily 90 day but he can use accordingly - Continue Tramadol 50mg  QID - reviewed [place] CSRS PMP AWARE - notify when ready for next 3 month refill does not need to come in to office. - Continue Diclofenac topical gel 1-2 times daily - Continue inc dose Tylenol      Relevant Medications   meloxicam (MOBIC) 7.5 MG tablet   Hypercholesteremia    Mostly controlled cholesterol on statin and lifestyle Last lipid panel 06/2020 Calculated ASCVD 10 yr risk score >15%  Plan: 1. Continue Lovastatin 20mg  daily now 2. Counseling on ASCVD risk reduction 3. Encourage improved lifestyle - low carb/cholesterol, reduce portion size, continue improving regular exercise      Relevant Medications   lovastatin (MEVACOR) 20 MG tablet   amLODipine (NORVASC) 5 MG tablet   CKD (chronic kidney disease), stage III (HCC)    Stable from prior reading over few years. CKD III  likely secondary to HTN, also history of NSAID use - Cr baseline 1.15  To 1.3  Plan: Limit NSAID meloxicam Improve hydration Recheck Cr in 6 months      Benign hypertension with CKD (chronic kidney disease) stage III (HCC) - Primary    Controlled HTN Reviewed outside readings Complication with CKD-III, fluctuation of Cr    Plan:  1. Continue current BP regimen Amlodipine 5mg  daily, Lisinopril 40mg  daily 2. Encourage improved lifestyle - low sodium diet, regular exercise 3. Continue monitor BP outside office, bring readings to next visit, if persistently >140/90 or new symptoms notify office sooner  Will check Cr next visit 6 mo      Relevant Medications   lovastatin (MEVACOR) 20 MG tablet   amLODipine (NORVASC) 5 MG  tablet      Meds ordered this encounter  Medications  . meloxicam (MOBIC) 7.5 MG tablet    Sig: Take 1 tablet (7.5 mg total) by mouth daily as needed for pain.    Dispense:  90 tablet    Refill:  3  . lovastatin (MEVACOR) 20 MG tablet    Sig: Take 1 tablet (20 mg total) by mouth daily.    Dispense:  90 tablet    Refill:  3  . amLODipine (NORVASC) 5 MG tablet    Sig: Take 1 tablet (5 mg total) by mouth daily.    Dispense:  90 tablet    Refill:  3      Follow up plan: Return in about 6 months (around 07/09/2021) for 6 month fasting lab only then 1 week later Annual Physical.  Future labs ordered for 07/14/21  , DO [facility] 01/06/2021, 9:20 AM

## 2021-01-06 NOTE — Assessment & Plan Note (Signed)
Mostly controlled cholesterol on statin and lifestyle Last lipid panel 06/2020 Calculated ASCVD 10 yr risk score >15%  Plan: 1. Continue Lovastatin 20mg  daily now 2. Counseling on ASCVD risk reduction 3. Encourage improved lifestyle - low carb/cholesterol, reduce portion size, continue improving regular exercise

## 2021-01-06 NOTE — Patient Instructions (Addendum)
Thank you for coming to the office today.  Refills in to [facility].  Call us when ready for tramadol refills.  Recommend Meloxicam 7.5mg  tablet smaller dose, now can take 3-5 times a week approximately, if need more can use more if can use less that would be good.  DUE for FASTING BLOOD WORK (no food or drink after midnight before the lab appointment, only water or coffee without cream/sugar on the morning of)  SCHEDULE "Lab Only" visit in the morning at the clinic for lab draw in 6 MONTHS   - Make sure Lab Only appointment is at about 1 week before your next appointment, so that results will be available  For Lab Results, once available within 2-3 days of blood draw, you can can log in to MyChart online to view your results and a brief explanation. Also, we can discuss results at next follow-up visit.   Please schedule a Follow-up Appointment to: Return in about 6 months (around 07/09/2021) for 6 month fasting lab only then 1 week later Annual Physical.  If you have any other questions or concerns, please feel free to call the office or send a message through MyChart. You may also schedule an earlier appointment if necessary.  Additionally, you may be receiving a survey about your experience at our office within a few days to 1 week by e-mail or mail. We value your feedback.  Saralyn Pilar, DO [facility], [place]

## 2021-01-06 NOTE — Assessment & Plan Note (Signed)
Stable chronic issue - improved on injections per ortho Episodic pain R knee medial, currently stable Chronic OA/DJD multiple joints knees, hands, feet, prior problems with shoulders. - Complicated by CKD limiting NSAID use - improved on max conservative therapy - Followed by [facility] Dr [name]  Plan: [facility] injection series - Continue current plan Meloxicam 7.5mg  3-4 times a week can adjust as needed, ordered for daily 90 day but he can use accordingly - Continue Tramadol 50mg  QID - reviewed [place] CSRS PMP AWARE - notify when ready for next 3 month refill does not need to come in to office. - Continue Diclofenac topical gel 1-2 times daily - Continue inc dose Tylenol

## 2021-01-06 NOTE — Assessment & Plan Note (Addendum)
Controlled HTN Reviewed outside readings Complication with CKD-III, fluctuation of Cr    Plan:  1. Continue current BP regimen Amlodipine 5mg  daily, Lisinopril 40mg  daily 2. Encourage improved lifestyle - low sodium diet, regular exercise 3. Continue monitor BP outside office, bring readings to next visit, if persistently >140/90 or new symptoms notify office sooner  Will check Cr next visit 6 mo

## 2021-02-03 ENCOUNTER — Other Ambulatory Visit: Payer: Self-pay | Admitting: Unknown Physician Specialty

## 2021-02-03 DIAGNOSIS — G8929 Other chronic pain: Secondary | ICD-10-CM

## 2021-02-03 DIAGNOSIS — M159 Polyosteoarthritis, unspecified: Secondary | ICD-10-CM

## 2021-02-03 DIAGNOSIS — M8949 Other hypertrophic osteoarthropathy, multiple sites: Secondary | ICD-10-CM

## 2021-02-03 NOTE — Telephone Encounter (Signed)
Requested medication (s) are due for refill today - 02/15/21  Requested medication (s) are on the active medication list -yes  Future visit scheduled -yes  Last refill: 11/18/20 #120 2 RF  Notes to clinic: Request RF non delegated Rx  Requested Prescriptions  Pending Prescriptions Disp Refills   traMADol (ULTRAM) 50 MG tablet [Pharmacy Med Name: TRAMADOL HYDROCHLORIDE 50 MG Tablet] 120 tablet     Sig: TAKE 1 TABLET FOUR TIMES DAILY      Not Delegated - Analgesics:  Opioid Agonists Failed - 02/03/2021 11:06 AM      Failed - This refill cannot be delegated      Failed - Urine Drug Screen completed in last 360 days      Passed - Valid encounter within last 6 months    Recent Outpatient Visits           4 weeks ago Benign hypertension with CKD (chronic kidney disease) stage III (HCC)   [facility] Smitty Cords, DO   7 months ago Annual physical exam   [facility] [place], Netta Neat, DO   1 year ago Benign hypertension with CKD (chronic kidney disease) stage III [facility])   [facility] Smitty Cords, DO   1 year ago Chronic pain of right knee   [facility] [place] Althea Charon, Netta Neat, DO   1 year ago Annual physical exam   [facility] Althea Charon, Netta Neat, DO       Future Appointments             In 5 months Althea Charon, Netta Neat, DO [facility], [facility]                 Requested Prescriptions  Pending Prescriptions Disp Refills   traMADol (ULTRAM) 50 MG tablet [Pharmacy Med Name: TRAMADOL HYDROCHLORIDE 50 MG Tablet] 120 tablet     Sig: TAKE 1 TABLET FOUR TIMES DAILY      Not Delegated - Analgesics:  Opioid Agonists Failed - 02/03/2021 11:06 AM      Failed - This refill cannot be delegated      Failed - Urine Drug Screen completed in last 360 days      Passed - Valid encounter within last 6 months    Recent Outpatient Visits           4 weeks  ago Benign hypertension with CKD (chronic kidney disease) stage III [facility])   [facility] Althea Charon, Netta Neat, DO   7 months ago Annual physical exam   [facility] Smitty Cords, DO   1 year ago Benign hypertension with CKD (chronic kidney disease) stage III [facility])   [facility] Smitty Cords, DO   1 year ago Chronic pain of right knee   [facility] Smitty Cords, DO   1 year ago Annual physical exam   [facility] Smitty Cords, DO       Future Appointments             In 5 months Althea Charon, Netta Neat, DO [facility], [facility]

## 2021-04-22 ENCOUNTER — Other Ambulatory Visit: Payer: Self-pay | Admitting: Family Medicine

## 2021-04-22 DIAGNOSIS — I129 Hypertensive chronic kidney disease with stage 1 through stage 4 chronic kidney disease, or unspecified chronic kidney disease: Secondary | ICD-10-CM

## 2021-04-22 DIAGNOSIS — N183 Chronic kidney disease, stage 3 unspecified: Secondary | ICD-10-CM

## 2021-05-20 ENCOUNTER — Other Ambulatory Visit: Payer: Self-pay | Admitting: Family Medicine

## 2021-05-20 DIAGNOSIS — N138 Other obstructive and reflux uropathy: Secondary | ICD-10-CM

## 2021-05-22 ENCOUNTER — Telehealth: Payer: Self-pay | Admitting: Family Medicine

## 2021-05-22 NOTE — Telephone Encounter (Signed)
Left message for patient to call back and schedule Medicare Annual Wellness Visit (AWV) to be done virtually or by telephone.  No hx of AWV eligible as of 12/14/10  Please schedule at anytime with [facility].      40 Minutes appointment   Any questions, please call me at [phone]  

## 2021-06-03 ENCOUNTER — Other Ambulatory Visit: Payer: Self-pay | Admitting: Family Medicine

## 2021-06-03 DIAGNOSIS — M159 Polyosteoarthritis, unspecified: Secondary | ICD-10-CM

## 2021-06-03 DIAGNOSIS — G8929 Other chronic pain: Secondary | ICD-10-CM

## 2021-06-03 DIAGNOSIS — M8949 Other hypertrophic osteoarthropathy, multiple sites: Secondary | ICD-10-CM

## 2021-06-04 NOTE — Telephone Encounter (Signed)
Requested medication (s) are due for refill today: yes  Requested medication (s) are on the active medication list: yes  Last refill:  02/03/21 #120 2 RF  Future visit scheduled: yes  Notes to clinic:  med not delegated to NT to RF   Requested Prescriptions  Pending Prescriptions Disp Refills   traMADol (ULTRAM) 50 MG tablet [Pharmacy Med Name: TRAMADOL HYDROCHLORIDE 50 MG Tablet] 120 tablet     Sig: TAKE 1 TABLET FOUR TIMES DAILY     Not Delegated - Analgesics:  Opioid Agonists Failed - 06/03/2021  6:49 PM      Failed - This refill cannot be delegated      Failed - Urine Drug Screen completed in last 360 days      Passed - Valid encounter within last 6 months    Recent Outpatient Visits           4 months ago Benign hypertension with CKD (chronic kidney disease) stage III [facility])   [facility] Smitty Cords, DO   11 months ago Annual physical exam   [facility] Smitty Cords, DO   1 year ago Benign hypertension with CKD (chronic kidney disease) stage III [facility])   [facility] Smitty Cords, DO   1 year ago Chronic pain of right knee   [facility] Smitty Cords, DO   1 year ago Annual physical exam   [facility] Smitty Cords, DO       Future Appointments             In 1 month Althea Charon, Netta Neat, DO [place], [facility]

## 2021-07-13 ENCOUNTER — Other Ambulatory Visit: Payer: Self-pay

## 2021-07-13 DIAGNOSIS — N183 Chronic kidney disease, stage 3 unspecified: Secondary | ICD-10-CM

## 2021-07-13 DIAGNOSIS — I129 Hypertensive chronic kidney disease with stage 1 through stage 4 chronic kidney disease, or unspecified chronic kidney disease: Secondary | ICD-10-CM

## 2021-07-13 DIAGNOSIS — Z0000 Encounter for general adult medical examination without abnormal findings: Secondary | ICD-10-CM

## 2021-07-13 DIAGNOSIS — N138 Other obstructive and reflux uropathy: Secondary | ICD-10-CM

## 2021-07-13 DIAGNOSIS — R7309 Other abnormal glucose: Secondary | ICD-10-CM

## 2021-07-13 DIAGNOSIS — M159 Polyosteoarthritis, unspecified: Secondary | ICD-10-CM

## 2021-07-13 DIAGNOSIS — E7800 Pure hypercholesterolemia, unspecified: Secondary | ICD-10-CM

## 2021-07-14 ENCOUNTER — Other Ambulatory Visit: Payer: Self-pay

## 2021-07-14 ENCOUNTER — Other Ambulatory Visit: Payer: Medicare HMO

## 2021-07-14 DIAGNOSIS — R7309 Other abnormal glucose: Secondary | ICD-10-CM | POA: Diagnosis not present

## 2021-07-14 DIAGNOSIS — N183 Chronic kidney disease, stage 3 unspecified: Secondary | ICD-10-CM | POA: Diagnosis not present

## 2021-07-14 DIAGNOSIS — Z0000 Encounter for general adult medical examination without abnormal findings: Secondary | ICD-10-CM | POA: Diagnosis not present

## 2021-07-14 DIAGNOSIS — N401 Enlarged prostate with lower urinary tract symptoms: Secondary | ICD-10-CM | POA: Diagnosis not present

## 2021-07-14 DIAGNOSIS — M8949 Other hypertrophic osteoarthropathy, multiple sites: Secondary | ICD-10-CM | POA: Diagnosis not present

## 2021-07-14 DIAGNOSIS — N138 Other obstructive and reflux uropathy: Secondary | ICD-10-CM | POA: Diagnosis not present

## 2021-07-14 DIAGNOSIS — E7800 Pure hypercholesterolemia, unspecified: Secondary | ICD-10-CM | POA: Diagnosis not present

## 2021-07-14 DIAGNOSIS — I129 Hypertensive chronic kidney disease with stage 1 through stage 4 chronic kidney disease, or unspecified chronic kidney disease: Secondary | ICD-10-CM | POA: Diagnosis not present

## 2021-07-15 LAB — COMPLETE METABOLIC PANEL WITH GFR
AG Ratio: 1.6 (calc) (ref 1.0–2.5)
ALT: 12 U/L (ref 9–46)
AST: 18 U/L (ref 10–35)
Albumin: 4.1 g/dL (ref 3.6–5.1)
Alkaline phosphatase (APISO): 75 U/L (ref 35–144)
BUN: 23 mg/dL (ref 7–25)
CO2: 25 mmol/L (ref 20–32)
Calcium: 8.9 mg/dL (ref 8.6–10.3)
Chloride: 106 mmol/L (ref 98–110)
Creat: 1.23 mg/dL (ref 0.70–1.28)
Globulin: 2.6 g/dL (calc) (ref 1.9–3.7)
Glucose, Bld: 88 mg/dL (ref 65–99)
Potassium: 4.6 mmol/L (ref 3.5–5.3)
Sodium: 139 mmol/L (ref 135–146)
Total Bilirubin: 0.6 mg/dL (ref 0.2–1.2)
Total Protein: 6.7 g/dL (ref 6.1–8.1)
eGFR: 62 mL/min/{1.73_m2} (ref >=60)

## 2021-07-15 LAB — CBC WITH DIFFERENTIAL/PLATELET
Absolute Monocytes: 345 cells/uL (ref 200–950)
Basophils Absolute: 53 cells/uL (ref 0–200)
Basophils Relative: 1 %
Eosinophils Absolute: 270 cells/uL (ref 15–500)
Eosinophils Relative: 5.1 %
HCT: 41.7 % (ref 38.5–50.0)
Hemoglobin: 13.8 g/dL (ref 13.2–17.1)
Lymphs Abs: 1187 cells/uL (ref 850–3900)
MCH: 29.7 pg (ref 27.0–33.0)
MCHC: 33.1 g/dL (ref 32.0–36.0)
MCV: 89.7 fL (ref 80.0–100.0)
MPV: 10.3 fL (ref 7.5–12.5)
Monocytes Relative: 6.5 %
Neutro Abs: 3445 cells/uL (ref 1500–7800)
Neutrophils Relative %: 65 %
Platelets: 225 10*3/uL (ref 140–400)
RBC: 4.65 10*6/uL (ref 4.20–5.80)
RDW: 12.8 % (ref 11.0–15.0)
Total Lymphocyte: 22.4 %
WBC: 5.3 10*3/uL (ref 3.8–10.8)

## 2021-07-15 LAB — HEMOGLOBIN A1C
Hgb A1c MFr Bld: 5.5 % of total Hgb (ref <5.7)
Mean Plasma Glucose: 111 mg/dL
eAG (mmol/L): 6.2 mmol/L

## 2021-07-15 LAB — LIPID PANEL
Cholesterol: 175 mg/dL (ref <200)
HDL: 69 mg/dL (ref >=40)
LDL Cholesterol (Calc): 93 mg/dL (calc)
Non-HDL Cholesterol (Calc): 106 mg/dL (calc) (ref <130)
Total CHOL/HDL Ratio: 2.5 (calc) (ref <5.0)
Triglycerides: 43 mg/dL (ref <150)

## 2021-07-15 LAB — PSA: PSA: 1.54 ng/mL (ref <=4.00)

## 2021-07-21 ENCOUNTER — Ambulatory Visit (INDEPENDENT_AMBULATORY_CARE_PROVIDER_SITE_OTHER): Payer: Medicare HMO | Admitting: Family Medicine

## 2021-07-21 ENCOUNTER — Encounter: Payer: Self-pay | Admitting: Family Medicine

## 2021-07-21 ENCOUNTER — Other Ambulatory Visit: Payer: Self-pay

## 2021-07-21 VITALS — BP 127/65 | HR 77 | Ht 70.0 in | Wt 157.4 lb

## 2021-07-21 DIAGNOSIS — G8929 Other chronic pain: Secondary | ICD-10-CM | POA: Diagnosis not present

## 2021-07-21 DIAGNOSIS — I129 Hypertensive chronic kidney disease with stage 1 through stage 4 chronic kidney disease, or unspecified chronic kidney disease: Secondary | ICD-10-CM

## 2021-07-21 DIAGNOSIS — M159 Polyosteoarthritis, unspecified: Secondary | ICD-10-CM | POA: Diagnosis not present

## 2021-07-21 DIAGNOSIS — R7309 Other abnormal glucose: Secondary | ICD-10-CM

## 2021-07-21 DIAGNOSIS — Z0000 Encounter for general adult medical examination without abnormal findings: Secondary | ICD-10-CM | POA: Diagnosis not present

## 2021-07-21 DIAGNOSIS — N182 Chronic kidney disease, stage 2 (mild): Secondary | ICD-10-CM | POA: Diagnosis not present

## 2021-07-21 DIAGNOSIS — N401 Enlarged prostate with lower urinary tract symptoms: Secondary | ICD-10-CM

## 2021-07-21 DIAGNOSIS — N183 Chronic kidney disease, stage 3 unspecified: Secondary | ICD-10-CM

## 2021-07-21 DIAGNOSIS — N138 Other obstructive and reflux uropathy: Secondary | ICD-10-CM

## 2021-07-21 DIAGNOSIS — Z23 Encounter for immunization: Secondary | ICD-10-CM | POA: Diagnosis not present

## 2021-07-21 DIAGNOSIS — E7800 Pure hypercholesterolemia, unspecified: Secondary | ICD-10-CM | POA: Diagnosis not present

## 2021-07-21 MED ORDER — FINASTERIDE 5 MG PO TABS: 5.0000 mg | ORAL_TABLET | Freq: Every day | ORAL | 3 refills | Status: DC

## 2021-07-21 MED ORDER — MELOXICAM 7.5 MG PO TABS: 7.5000 mg | ORAL_TABLET | Freq: Every day | ORAL | 3 refills | Status: DC | PRN

## 2021-07-21 MED ORDER — LISINOPRIL 40 MG PO TABS: 40.0000 mg | ORAL_TABLET | Freq: Every day | ORAL | 3 refills | Status: DC

## 2021-07-21 MED ORDER — AMLODIPINE BESYLATE 5 MG PO TABS: 5.0000 mg | ORAL_TABLET | Freq: Every day | ORAL | 3 refills | Status: DC

## 2021-07-21 MED ORDER — LOVASTATIN 20 MG PO TABS: 20.0000 mg | ORAL_TABLET | Freq: Every day | ORAL | 3 refills | Status: DC

## 2021-07-21 MED ORDER — TRAMADOL HCL 50 MG PO TABS: 50.0000 mg | ORAL_TABLET | Freq: Four times a day (QID) | ORAL | 5 refills | Status: DC | PRN

## 2021-07-21 NOTE — Patient Instructions (Addendum)
Thank you for coming to the office today.  Keep up the great work  [employer] today  Refilled all meds to [employer]. Including Tramadol  Improved kidney function.  Please schedule a Follow-up Appointment to: Return in about 6 months (around 01/19/2022) for 6 month follow-up HTN CKD Pain refill.  If you have any other questions or concerns, please feel free to call the office or send a message through MyChart. You may also schedule an earlier appointment if necessary.  Additionally, you may be receiving a survey about your experience at our office within a few days to 1 week by e-mail or mail. We value your feedback.  Saralyn Pilar, DO [facility], [place]

## 2021-07-21 NOTE — Assessment & Plan Note (Signed)
Mostly controlled cholesterol on statin and lifestyle Last lipid panel 06/2021  The 10-year ASCVD risk score (Arnett DK, et al., 2019) is: 22.8%   Plan: 1. Continue Lovastatin 20mg  daily now 2. Counseling on ASCVD risk reduction 3. Encourage improved lifestyle - low carb/cholesterol, reduce portion size, continue improving regular exercise

## 2021-07-21 NOTE — Assessment & Plan Note (Signed)
Stable chronic issue - improved on injections per ortho Episodic pain R knee medial, currently stable Chronic OA/DJD multiple joints knees, hands, feet, prior problems with shoulders. - Complicated by CKD limiting NSAID use - improved on max conservative therapy - Followed by [facility] Dr Krasinski  Plan: [facility] injection series - Continue current plan Meloxicam 7.5mg 3-4 times a week can adjust as needed, ordered for daily 90 day but he can use accordingly - Continue Tramadol 50mg QID - reviewed [place] CSRS PMP AWARE - notify when ready for next 3 month refill does not need to come in to office. - Continue Diclofenac topical gel 1-2 times daily - Continue inc dose Tylenol 

## 2021-07-21 NOTE — Progress Notes (Signed)
Subjective:    Patient ID: Jerry Simmons, male    DOB: 19-Nov-1945, 75 y.o.   MRN: [id number]  Jerry Simmons is a 75 y.o. male presenting on 07/21/2021 for Annual Exam   HPI  Here for Annual Physical and Lab Review.  Elevated A1c A1c improved to 5.5. Stable from previous. - Not taking medicine - Lifestyle - Diet: Improving diet, less sweets, more water - Exercise: active, walking - mowing yards 4 x week   Osteoarthritis, Multiple joints (knees, hands, feet) / Chronic Knee Pain Chronic problem with arthritis, last seen for same issue multiple times Followed by [facility] Dr Mack Guise Failed steroid injections Completed PT and Synvisc injections   Today some improvement in knee pain but still chronic issue. R>L worse. admits to occasional episodic pain in R knee medial aspect primarily, not a constant pain. He admits to a grinding feeling at times. Continues to work with Dr Mack Guise - improved on injections On controlled subs agreement opoid for Tramadol, doing well, taking QID rarely takes a half or skip a dose. - Taking Meloxicam 44m x 2-4 x a week but may need more frequent at times. Diclofenac gel topical 1-2x times most days (hands, feet, knees), Tylenol Ext Str 5075m2-3 times daily - He is able to maintain daily function -Also taking Glucosamine 150010mnd Chondroitin 1200m66mice daily Denies redness swelling worsening pain, numbness tingling weakness other joint problem.   CHRONIC HTN CKD III Reports no new concerns Current Meds - Amlodipine 5mg 53mly, Lisinopril 40mg 82my Reports good compliance, took meds today. Tolerating well, w/o complaints. Denies CP, dyspnea, HA, edema, dizziness / lightheadedness   HYPERLIPIDEMIA: - Reports no concerns. Last lipid 1022, improved controlled On Lovastatin 20mg d70m, tolerating w/o side effect   Health Maintenance:   UTD COVID19 vaccine, booster done 07/01/21  Due for Flu Shot, will receive today   Cologuard  negative 06/26/19, will be due for repeat test in 1 year 2023  Tdap 2016  Shingrix vaccine completed.   Depression screen PHQ 2/9[facility]/04/2021 07/01/2020 12/18/2019  Decreased Interest 0 0 0  Down, Depressed, Hopeless 0 0 0  PHQ - 2 Score 0 0 0    Past Medical History:  Diagnosis Date   Arthritis    Past Surgical History:  Procedure Laterality Date   EYE SURGERY  12/10/2018   Right eye   Social History   Socioeconomic History   Marital status: Married    Spouse name: Not on file   Number of children: Not on file   Years of education: Not on file   Highest education level: Not on file  Occupational History   Not on file  Tobacco Use   Smoking status: Former    Packs/day: 3.00    Years: 20.00    Pack years: 60.00    Types: Cigarettes    Quit date: 09/16/1985    Years since quitting: 35.8   Smokeless tobacco: Former  Substance and Sexual Activity   Alcohol use: Yes    Alcohol/week: 1.0 standard drink    Types: 1 Glasses of wine per week    Comment: Only has wine on occassional.    Drug use: No   Sexual activity: Not on file  Other Topics Concern   Not on file  Social History Narrative   Not on file   Social Determinants of Health   Financial Resource Strain: Not on file  Food Insecurity: Not on file  Transportation Needs: Not on file  Physical Activity: Not on file  Stress: Not on file  Social Connections: Not on file  Intimate Partner Violence: Not on file   History reviewed. No pertinent family history. Current Outpatient Medications on File Prior to Visit  Medication Sig   diclofenac sodium (VOLTAREN) 1 % GEL Apply 2 g topically 3 (three) times daily as needed. For knees and hands, arthritis   Glucosamine-Chondroit-Vit C-Mn (GLUCOSAMINE 1500 COMPLEX PO) Take 1,500 mg 2 (two) times daily by mouth.   No current facility-administered medications on file prior to visit.    Review of Systems  Constitutional:  Negative for activity change, appetite change,  chills, diaphoresis, fatigue and fever.  HENT:  Negative for congestion and hearing loss.   Eyes:  Negative for visual disturbance.  Respiratory:  Negative for cough, chest tightness, shortness of breath and wheezing.   Cardiovascular:  Negative for chest pain, palpitations and leg swelling.  Gastrointestinal:  Negative for abdominal pain, constipation, diarrhea, nausea and vomiting.  Genitourinary:  Negative for dysuria, frequency and hematuria.  Musculoskeletal:  Negative for arthralgias and neck pain.  Skin:  Negative for rash.  Neurological:  Negative for dizziness, weakness, light-headedness, numbness and headaches.  Hematological:  Negative for adenopathy.  Psychiatric/Behavioral:  Negative for behavioral problems, dysphoric mood and sleep disturbance.   Per HPI unless specifically indicated above     Objective:    BP 127/65   Pulse 77   Ht '5\' 10"'  (1.778 m)   Wt 157 lb 6.4 oz (71.4 kg)   SpO2 97%   BMI 22.58 kg/m   Wt Readings from Last 3 Encounters:  07/21/21 157 lb 6.4 oz (71.4 kg)  01/06/21 159 lb 9.6 oz (72.4 kg)  07/01/20 155 lb (70.3 kg)    Physical Exam Vitals and nursing note reviewed.  Constitutional:      General: He is not in acute distress.    Appearance: He is well-developed. He is not diaphoretic.     Comments: Well-appearing, comfortable, cooperative  HENT:     Head: Normocephalic and atraumatic.  Eyes:     General:        Right eye: No discharge.        Left eye: No discharge.     Conjunctiva/sclera: Conjunctivae normal.     Pupils: Pupils are equal, round, and reactive to light.  Neck:     Thyroid: No thyromegaly.     Vascular: No carotid bruit.  Cardiovascular:     Rate and Rhythm: Normal rate and regular rhythm.     Pulses: Normal pulses.     Heart sounds: Normal heart sounds. No murmur heard. Pulmonary:     Effort: Pulmonary effort is normal. No respiratory distress.     Breath sounds: Normal breath sounds. No wheezing or rales.   Abdominal:     General: Bowel sounds are normal. There is no distension.     Palpations: Abdomen is soft. There is no mass.     Tenderness: There is no abdominal tenderness.  Musculoskeletal:        General: No tenderness. Normal range of motion.     Cervical back: Normal range of motion and neck supple.     Right lower leg: No edema.     Left lower leg: No edema.     Comments: Upper / Lower Extremities: - Normal muscle tone, strength bilateral upper extremities 5/5, lower extremities 5/5  Lymphadenopathy:     Cervical: No cervical adenopathy.  Skin:    General: Skin is  warm and dry.     Findings: No erythema or rash.  Neurological:     Mental Status: He is alert and oriented to person, place, and time.     Comments: Distal sensation intact to light touch all extremities  Psychiatric:        Mood and Affect: Mood normal.        Behavior: Behavior normal.        Thought Content: Thought content normal.     Comments: Well groomed, good eye contact, normal speech and thoughts       Results for orders placed or performed in visit on 07/13/21  PSA  Result Value Ref Range   PSA 1.54 < OR = 4.00 ng/mL  Lipid panel  Result Value Ref Range   Cholesterol 175 <200 mg/dL   HDL 69 > OR = 40 mg/dL   Triglycerides 43 <150 mg/dL   LDL Cholesterol (Calc) 93 mg/dL (calc)   Total CHOL/HDL Ratio 2.5 <5.0 (calc)   Non-HDL Cholesterol (Calc) 106 <130 mg/dL (calc)  COMPLETE METABOLIC PANEL WITH GFR  Result Value Ref Range   Glucose, Bld 88 65 - 99 mg/dL   BUN 23 7 - 25 mg/dL   Creat 1.23 0.70 - 1.28 mg/dL   eGFR 62 > OR = 60 mL/min/1.35m   BUN/Creatinine Ratio NOT APPLICABLE 6 - 22 (calc)   Sodium 139 135 - 146 mmol/L   Potassium 4.6 3.5 - 5.3 mmol/L   Chloride 106 98 - 110 mmol/L   CO2 25 20 - 32 mmol/L   Calcium 8.9 8.6 - 10.3 mg/dL   Total Protein 6.7 6.1 - 8.1 g/dL   Albumin 4.1 3.6 - 5.1 g/dL   Globulin 2.6 1.9 - 3.7 g/dL (calc)   AG Ratio 1.6 1.0 - 2.5 (calc)   Total  Bilirubin 0.6 0.2 - 1.2 mg/dL   Alkaline phosphatase (APISO) 75 35 - 144 U/L   AST 18 10 - 35 U/L   ALT 12 9 - 46 U/L  CBC with Differential/Platelet  Result Value Ref Range   WBC 5.3 3.8 - 10.8 Thousand/uL   RBC 4.65 4.20 - 5.80 Million/uL   Hemoglobin 13.8 13.2 - 17.1 g/dL   HCT 41.7 38.5 - 50.0 %   MCV 89.7 80.0 - 100.0 fL   MCH 29.7 27.0 - 33.0 pg   MCHC 33.1 32.0 - 36.0 g/dL   RDW 12.8 11.0 - 15.0 %   Platelets 225 140 - 400 Thousand/uL   MPV 10.3 7.5 - 12.5 fL   Neutro Abs 3,445 1,500 - 7,800 cells/uL   Lymphs Abs 1,187 850 - 3,900 cells/uL   Absolute Monocytes 345 200 - 950 cells/uL   Eosinophils Absolute 270 15 - 500 cells/uL   Basophils Absolute 53 0 - 200 cells/uL   Neutrophils Relative % 65 %   Total Lymphocyte 22.4 %   Monocytes Relative 6.5 %   Eosinophils Relative 5.1 %   Basophils Relative 1.0 %  Hemoglobin A1c  Result Value Ref Range   Hgb A1c MFr Bld 5.5 <5.7 % of total Hgb   Mean Plasma Glucose 111 mg/dL   eAG (mmol/L) 6.2 mmol/L      Assessment & Plan:   Problem List Items Addressed This Visit     Osteoarthritis of multiple joints    Stable chronic issue - improved on injections per ortho Episodic pain R knee medial, currently stable Chronic OA/DJD multiple joints knees, hands, feet, prior problems with shoulders. - Complicated by  CKD limiting NSAID use - improved on max conservative therapy - Followed by [facility] Dr Mack Guise  Plan: [facility] injection series - Continue current plan Meloxicam 7.45m 3-4 times a week can adjust as needed, ordered for daily 90 day but he can use accordingly - Continue Tramadol 566mQID - reviewed [place] CSRS PMP AWARE - notify when ready for next 3 month refill does not need to come in to office. - Continue Diclofenac topical gel 1-2 times daily - Continue inc dose Tylenol      Relevant Medications   meloxicam (MOBIC) 7.5 MG tablet   traMADol (ULTRAM) 50 MG tablet   Hypercholesteremia    Mostly controlled  cholesterol on statin and lifestyle Last lipid panel 06/2021  The 10-year ASCVD risk score (Arnett DK, et al., 2019) is: 22.8%   Plan: 1. Continue Lovastatin 2055maily now 2. Counseling on ASCVD risk reduction 3. Encourage improved lifestyle - low carb/cholesterol, reduce portion size, continue improving regular exercise      Relevant Medications   amLODipine (NORVASC) 5 MG tablet   lisinopril (ZESTRIL) 40 MG tablet   lovastatin (MEVACOR) 20 MG tablet   Elevated hemoglobin A1c   CKD (chronic kidney disease), stage II   Chronic pain   Relevant Medications   meloxicam (MOBIC) 7.5 MG tablet   traMADol (ULTRAM) 50 MG tablet   BPH with obstruction/lower urinary tract symptoms    Chronic BPH Stable on Finasteride Off Flomax PSA inc to 1.5, appropriate based on finasteride calculation      Relevant Medications   finasteride (PROSCAR) 5 MG tablet   Benign hypertension with CKD (chronic kidney disease), stage II    Controlled HTN Reviewed outside readings Complication with CKD-II improved  fluctuation of Cr    Plan:  1. Continue current BP regimen Amlodipine 5mg28mily, Lisinopril 40mg73mly 2. Encourage improved lifestyle - low sodium diet, regular exercise 3. Continue monitor BP outside office, bring readings to next visit, if persistently >140/90 or new symptoms notify office sooner  Consider Cr check in 6 months or 12 mo      Relevant Medications   amLODipine (NORVASC) 5 MG tablet   lisinopril (ZESTRIL) 40 MG tablet   lovastatin (MEVACOR) 20 MG tablet   Other Visit Diagnoses     Annual physical exam    -  Primary   Needs flu shot       Relevant Orders   Flu Vaccine QUAD High Dose(Fluad) (Completed)   Benign hypertension with CKD (chronic kidney disease) stage III (HCC)       Relevant Medications   amLODipine (NORVASC) 5 MG tablet   lisinopril (ZESTRIL) 40 MG tablet   lovastatin (MEVACOR) 20 MG tablet       Updated Health Maintenance information Flu Shot  today He already has updated COVID Booster Reviewed recent lab results with patient Encouraged improvement to lifestyle with diet and exercise Goal of weight loss   Meds ordered this encounter  Medications   amLODipine (NORVASC) 5 MG tablet    Sig: Take 1 tablet (5 mg total) by mouth daily.    Dispense:  90 tablet    Refill:  3   finasteride (PROSCAR) 5 MG tablet    Sig: Take 1 tablet (5 mg total) by mouth daily.    Dispense:  90 tablet    Refill:  3   lisinopril (ZESTRIL) 40 MG tablet    Sig: Take 1 tablet (40 mg total) by mouth daily.    Dispense:  90 tablet    Refill:  3   lovastatin (MEVACOR) 20 MG tablet    Sig: Take 1 tablet (20 mg total) by mouth daily.    Dispense:  90 tablet    Refill:  3   meloxicam (MOBIC) 7.5 MG tablet    Sig: Take 1 tablet (7.5 mg total) by mouth daily as needed for pain.    Dispense:  90 tablet    Refill:  3   traMADol (ULTRAM) 50 MG tablet    Sig: Take 1 tablet (50 mg total) by mouth 4 (four) times daily as needed for moderate pain.    Dispense:  120 tablet    Refill:  5     Follow up plan: Return in about 6 months (around 01/19/2022) for 6 month follow-up HTN CKD Pain refill.  Nobie Putnam, [facility] 07/21/2021, 2:40 PM

## 2021-07-21 NOTE — Assessment & Plan Note (Signed)
Controlled HTN Reviewed outside readings Complication with CKD-II improved  fluctuation of Cr    Plan:  1. Continue current BP regimen Amlodipine 5mg  daily, Lisinopril 40mg  daily 2. Encourage improved lifestyle - low sodium diet, regular exercise 3. Continue monitor BP outside office, bring readings to next visit, if persistently >140/90 or new symptoms notify office sooner  Consider Cr check in 6 months or 12 mo

## 2021-07-21 NOTE — Assessment & Plan Note (Signed)
Chronic BPH Stable on Finasteride Off Flomax PSA inc to 1.5, appropriate based on finasteride calculation

## 2021-08-16 ENCOUNTER — Telehealth: Payer: Self-pay | Admitting: Family Medicine

## 2021-08-16 NOTE — Telephone Encounter (Signed)
Left message for patient to call back and schedule Medicare Annual Wellness Visit (AWV) to be done virtually or by telephone.  No hx of AWV eligible as of 12/14/10  Please schedule at anytime with [facility].      40 Minutes appointment   Any questions, please call me at [phone]

## 2021-11-15 ENCOUNTER — Ambulatory Visit: Payer: Medicare HMO | Admitting: Family Medicine

## 2021-11-15 DIAGNOSIS — M170 Bilateral primary osteoarthritis of knee: Secondary | ICD-10-CM | POA: Diagnosis not present

## 2021-11-22 DIAGNOSIS — M170 Bilateral primary osteoarthritis of knee: Secondary | ICD-10-CM | POA: Diagnosis not present

## 2021-11-30 DIAGNOSIS — M170 Bilateral primary osteoarthritis of knee: Secondary | ICD-10-CM | POA: Diagnosis not present

## 2021-12-22 ENCOUNTER — Other Ambulatory Visit: Payer: Self-pay | Admitting: Family Medicine

## 2021-12-22 DIAGNOSIS — M159 Polyosteoarthritis, unspecified: Secondary | ICD-10-CM

## 2021-12-22 NOTE — Telephone Encounter (Signed)
Requested Prescriptions  ?Pending Prescriptions Disp Refills  ?? meloxicam (MOBIC) 7.5 MG tablet [Pharmacy Med Name: MELOXICAM 7.5 MG Tablet] 90 tablet 1  ?  Sig: TAKE 1 TABLET EVERY DAY AS NEEDED FOR PAIN  ?  ? Analgesics:  COX2 Inhibitors Failed - 12/22/2021  6:11 AM  ?  ?  Failed - Manual Review: Labs are only required if the patient has taken medication for more than 8 weeks.  ?  ?  Passed - HGB in normal range and within 360 days  ?  Hemoglobin  ?Date Value Ref Range Status  ?07/14/2021 13.8 13.2 - 17.1 g/dL Final  ?10/26/2015 15.1 12.6 - 17.7 g/dL Final  ?   ?  ?  Passed - Cr in normal range and within 360 days  ?  Creat  ?Date Value Ref Range Status  ?07/14/2021 1.23 0.70 - 1.28 mg/dL Final  ?   ?  ?  Passed - HCT in normal range and within 360 days  ?  HCT  ?Date Value Ref Range Status  ?07/14/2021 41.7 38.5 - 50.0 % Final  ? ?Hematocrit  ?Date Value Ref Range Status  ?10/26/2015 43.0 37.5 - 51.0 % Final  ?   ?  ?  Passed - AST in normal range and within 360 days  ?  AST  ?Date Value Ref Range Status  ?07/14/2021 18 10 - 35 U/L Final  ?   ?  ?  Passed - ALT in normal range and within 360 days  ?  ALT  ?Date Value Ref Range Status  ?07/14/2021 12 9 - 46 U/L Final  ?   ?  ?  Passed - eGFR is 30 or above and within 360 days  ?  GFR, Est African American  ?Date Value Ref Range Status  ?06/24/2020 63 > OR = 60 mL/min/1.73m2 Final  ? ?GFR, Est Non African American  ?Date Value Ref Range Status  ?06/24/2020 54 (L) > OR = 60 mL/min/1.73m2 Final  ? ?eGFR  ?Date Value Ref Range Status  ?07/14/2021 62 > OR = 60 mL/min/1.73m2 Final  ?  Comment:  ?  The eGFR is based on the CKD-EPI 2021 equation. To calculate  ?the new eGFR from a previous Creatinine or Cystatin C ?result, go to https://www.kidney.org/professionals/ ?kdoqi/gfr%5Fcalculator ?  ?   ?  ?  Passed - Patient is not pregnant  ?  ?  Passed - Valid encounter within last 12 months  ?  Recent Outpatient Visits   ?      ? 5 months ago Annual physical exam  ? [facility] Karamalegos, Alexander J, DO  ? 11 months ago Benign hypertension with CKD (chronic kidney disease) stage III (HCC)  ? [facility] Karamalegos, Alexander J, DO  ? 1 year ago Annual physical exam  ? [facility] Karamalegos, Alexander J, DO  ? 2 years ago Benign hypertension with CKD (chronic kidney disease) stage III (HCC)  ? [facility] Karamalegos, Alexander J, DO  ? 2 years ago Chronic pain of right knee  ? [facility] Karamalegos, Alexander J, DO  ?  ?  ?Future Appointments   ?        ? In 4 weeks Karamalegos, Alexander J, DO [facility], [facility]  ?  ? ?  ?  ?  ? ?

## 2022-01-08 ENCOUNTER — Other Ambulatory Visit: Payer: Self-pay | Admitting: Family Medicine

## 2022-01-08 DIAGNOSIS — M159 Polyosteoarthritis, unspecified: Secondary | ICD-10-CM

## 2022-01-08 DIAGNOSIS — G8929 Other chronic pain: Secondary | ICD-10-CM

## 2022-01-10 NOTE — Telephone Encounter (Signed)
Requested medication (s) are due for refill today: yes ? ?Requested medication (s) are on the active medication list: yes ? ?Last refill:  07/21/21 #120/5 ? ?Future visit scheduled: yes ? ?Notes to clinic:  Unable to refill per protocol, cannot delegate. ? ? ? ?  ?Requested Prescriptions  ?Pending Prescriptions Disp Refills  ? traMADol (ULTRAM) 50 MG tablet [Pharmacy Med Name: TRAMADOL HYDROCHLORIDE 50 MG Tablet] 120 tablet   ?  Sig: Take 1 tablet (50 mg total) by mouth 4 (four) times daily as needed for moderate pain.  ?  ? Not Delegated - Analgesics:  Opioid Agonists Failed - 01/08/2022  7:03 PM  ?  ?  Failed - This refill cannot be delegated  ?  ?  Failed - Urine Drug Screen completed in last 360 days  ?  ?  Failed - Valid encounter within last 3 months  ?  Recent Outpatient Visits   ? ?      ? 5 months ago Annual physical exam  ? [place], DO  ? 1 year ago Benign hypertension with CKD (chronic kidney disease) stage III ([place])  ? [place], DO  ? 1 year ago Annual physical exam  ? [place], DO  ? 2 years ago Benign hypertension with CKD (chronic kidney disease) stage III ([place])  ? [place], DO  ? 2 years ago Chronic pain of right knee  ? [place], DO  ? ?  ?  ?Future Appointments   ? ?        ? In 1 week Parks Ranger Devonne Doughty, DO [facility], [place]  ? ?  ? ?  ?  ?  ? ?

## 2022-01-19 ENCOUNTER — Ambulatory Visit: Payer: Medicare HMO | Admitting: Family Medicine

## 2022-01-26 ENCOUNTER — Ambulatory Visit (INDEPENDENT_AMBULATORY_CARE_PROVIDER_SITE_OTHER): Payer: Medicare HMO | Admitting: Family Medicine

## 2022-01-26 ENCOUNTER — Other Ambulatory Visit: Payer: Self-pay | Admitting: Family Medicine

## 2022-01-26 ENCOUNTER — Encounter: Payer: Self-pay | Admitting: Family Medicine

## 2022-01-26 VITALS — BP 130/58 | HR 60 | Ht 70.0 in | Wt 160.4 lb

## 2022-01-26 DIAGNOSIS — Z0000 Encounter for general adult medical examination without abnormal findings: Secondary | ICD-10-CM

## 2022-01-26 DIAGNOSIS — F5101 Primary insomnia: Secondary | ICD-10-CM

## 2022-01-26 DIAGNOSIS — M159 Polyosteoarthritis, unspecified: Secondary | ICD-10-CM

## 2022-01-26 DIAGNOSIS — I129 Hypertensive chronic kidney disease with stage 1 through stage 4 chronic kidney disease, or unspecified chronic kidney disease: Secondary | ICD-10-CM | POA: Diagnosis not present

## 2022-01-26 DIAGNOSIS — N182 Chronic kidney disease, stage 2 (mild): Secondary | ICD-10-CM

## 2022-01-26 DIAGNOSIS — R7309 Other abnormal glucose: Secondary | ICD-10-CM

## 2022-01-26 DIAGNOSIS — E7800 Pure hypercholesterolemia, unspecified: Secondary | ICD-10-CM

## 2022-01-26 DIAGNOSIS — N138 Other obstructive and reflux uropathy: Secondary | ICD-10-CM

## 2022-01-26 MED ORDER — ZOLPIDEM TARTRATE 5 MG PO TABS: ORAL_TABLET | ORAL | 2 refills | Status: DC

## 2022-01-26 NOTE — Assessment & Plan Note (Signed)
Stable chronic issue - improved on injections per ortho Episodic pain R knee medial, currently stable Chronic OA/DJD multiple joints knees, hands, feet, prior problems with shoulders. - Complicated by CKD limiting NSAID use - improved on max conservative therapy - Followed by [facility] Dr Krasinski  Plan: [facility] injection series - Continue current plan Meloxicam 7.5mg 3-4 times a week can adjust as needed, ordered for daily 90 day but he can use accordingly - Continue Tramadol 50mg QID - reviewed  CSRS PMP AWARE - notify when ready for next 3 month refill does not need to come in to office. - Continue Diclofenac topical gel 1-2 times daily - Continue inc dose Tylenol 

## 2022-01-26 NOTE — Assessment & Plan Note (Signed)
Prior A1c in range ? ?Plan:  ?1. Not on any therapy currently  ?2. Encourage improved lifestyle - low carb, low sugar diet, reduce portion size, continue improving regular exercise ?

## 2022-01-26 NOTE — Progress Notes (Signed)
? ?Subjective:  ? ? Patient ID: Jerry Simmons, male    DOB: 08-10-1946, 76 y.o.   MRN: [id number] ? ?Jerry Simmons is a 76 y.o. male presenting on 01/26/2022 for Hypertension ? ? ?HPI ? ?Elevated A1c ?A1c improved to 5.5. Stable from previous. ?- Not taking medicine ?- Lifestyle ?- Diet: Improving diet, less sweets, more water ?- Exercise: active, walking - mowing yards 4 x week ?  ?Osteoarthritis, Multiple joints (knees, hands, feet) / Chronic Knee Pain ?Chronic problem with arthritis, last seen for same issue multiple times ?Followed by [facility] Dr Mack Guise ?Failed steroid injections ?Completed PT and Synvisc injections ?Some improvement, he has episodic flare ups Left Inner Knee ?  ?Continues to work with Dr Mack Guise - improved on injections ?On controlled subs agreement opoid for Tramadol, doing well, taking QID rarely takes a half or skip a dose. ?- Taking Meloxicam  half tab for 7.53m - (of 151m x 2-4 x a week but may need more frequent at times. ?Diclofenac gel topical 1-2x times most days (hands, feet, knees), Tylenol Ext Str 50087m-3 times daily ?- He is able to maintain daily function ?-Also taking Glucosamine 1500m66md Chondroitin 1200mg8mce daily ?Denies redness swelling worsening pain, numbness tingling weakness other joint problem. ?  ?CHRONIC HTN CKD II ?Reports no new concerns ?Current Meds - Amlodipine 5mg d21my, Lisinopril 40mg d82m ?Reports good compliance, took meds today. Tolerating well, w/o complaints. ?Denies CP, dyspnea, HA, edema, dizziness / lightheadedness ?  ?Insomnia, primary ?Admits insomnia stays up at times poor sleep 1-2 times per week, has taken Ambien in past, with good results. Asking for own rx. ?  ?Health Maintenance: ?  ?UTD COVID19 vaccine, booster done 07/01/21 ?   ?Cologuard negative 06/26/19, will be due for repeat test in 1 year 2023 ? ? ?  07/21/2021  ?  2:44 PM 07/01/2020  ?  2:24 PM 12/18/2019  ?  2:37 PM  ?Depression screen PHQ 2/9  ?Decreased Interest 0 0 0   ?Down, Depressed, Hopeless 0 0 0  ?PHQ - 2 Score 0 0 0  ? ? ?Social History  ? ?Tobacco Use  ? Smoking status: Former  ?  Packs/day: 3.00  ?  Years: 20.00  ?  Pack years: 60.00  ?  Types: Cigarettes  ?  Quit date: 09/16/1985  ?  Years since quitting: 36.3  ? Smokeless tobacco: Former  ?Substance Use Topics  ? Alcohol use: Yes  ?  Alcohol/week: 1.0 standard drink  ?  Types: 1 Glasses of wine per week  ?  Comment: Only has wine on occassional.   ? Drug use: No  ? ? ?Review of Systems ?Per HPI unless specifically indicated above ? ?   ?Objective:  ?  ?BP (!) 130/58   Pulse 60   Ht _0  (1.778 m)   Wt 160 lb 6.4 oz (72.8 kg)   SpO2 97%   BMI 23.02 kg/m?   ?Wt Readings from Last 3 Encounters:  ?01/26/22 160 lb 6.4 oz (72.8 kg)  ?07/21/21 157 lb 6.4 oz (71.4 kg)  ?01/06/21 159 lb 9.6 oz (72.4 kg)  ?  ?Physical Exam ?Vitals and nursing note reviewed.  ?Constitutional:   ?   General: He is not in acute distress. ?   Appearance: Normal appearance. He is well-developed. He is not diaphoretic.  ?   Comments: Well-appearing, comfortable, cooperative  ?HENT:  ?   Head: Normocephalic and atraumatic.  ?Eyes:  ?   General:     ?  Right eye: No discharge.     ?   Left eye: No discharge.  ?   Conjunctiva/sclera: Conjunctivae normal.  ?Cardiovascular:  ?   Rate and Rhythm: Normal rate.  ?Pulmonary:  ?   Effort: Pulmonary effort is normal.  ?Musculoskeletal:     ?   General: Normal range of motion.  ?   Comments: Bilateral knees with arthritis bulky some crepitus, at baseline  ?Skin: ?   General: Skin is warm and dry.  ?   Findings: No erythema or rash.  ?Neurological:  ?   Mental Status: He is alert and oriented to person, place, and time.  ?Psychiatric:     ?   Mood and Affect: Mood normal.     ?   Behavior: Behavior normal.     ?   Thought Content: Thought content normal.  ?   Comments: Well groomed, good eye contact, normal speech and thoughts  ? ? ?Recent Labs  ?  07/14/21 ?0812  ?HGBA1C 5.5  ? ? ? ?Results for orders  placed or performed in visit on 07/13/21  ?PSA  ?Result Value Ref Range  ? PSA 1.54 < OR = 4.00 ng/mL  ?Lipid panel  ?Result Value Ref Range  ? Cholesterol 175 <200 mg/dL  ? HDL 69 > OR = 40 mg/dL  ? Triglycerides 43 <150 mg/dL  ? LDL Cholesterol (Calc) 93 mg/dL (calc)  ? Total CHOL/HDL Ratio 2.5 <5.0 (calc)  ? Non-HDL Cholesterol (Calc) 106 <130 mg/dL (calc)  ?COMPLETE METABOLIC PANEL WITH GFR  ?Result Value Ref Range  ? Glucose, Bld 88 65 - 99 mg/dL  ? BUN 23 7 - 25 mg/dL  ? Creat 1.23 0.70 - 1.28 mg/dL  ? eGFR 62 > OR = 60 mL/min/1.35m  ? BUN/Creatinine Ratio NOT APPLICABLE 6 - 22 (calc)  ? Sodium 139 135 - 146 mmol/L  ? Potassium 4.6 3.5 - 5.3 mmol/L  ? Chloride 106 98 - 110 mmol/L  ? CO2 25 20 - 32 mmol/L  ? Calcium 8.9 8.6 - 10.3 mg/dL  ? Total Protein 6.7 6.1 - 8.1 g/dL  ? Albumin 4.1 3.6 - 5.1 g/dL  ? Globulin 2.6 1.9 - 3.7 g/dL (calc)  ? AG Ratio 1.6 1.0 - 2.5 (calc)  ? Total Bilirubin 0.6 0.2 - 1.2 mg/dL  ? Alkaline phosphatase (APISO) 75 35 - 144 U/L  ? AST 18 10 - 35 U/L  ? ALT 12 9 - 46 U/L  ?CBC with Differential/Platelet  ?Result Value Ref Range  ? WBC 5.3 3.8 - 10.8 Thousand/uL  ? RBC 4.65 4.20 - 5.80 Million/uL  ? Hemoglobin 13.8 13.2 - 17.1 g/dL  ? HCT 41.7 38.5 - 50.0 %  ? MCV 89.7 80.0 - 100.0 fL  ? MCH 29.7 27.0 - 33.0 pg  ? MCHC 33.1 32.0 - 36.0 g/dL  ? RDW 12.8 11.0 - 15.0 %  ? Platelets 225 140 - 400 Thousand/uL  ? MPV 10.3 7.5 - 12.5 fL  ? Neutro Abs 3,445 1,500 - 7,800 cells/uL  ? Lymphs Abs 1,187 850 - 3,900 cells/uL  ? Absolute Monocytes 345 200 - 950 cells/uL  ? Eosinophils Absolute 270 15 - 500 cells/uL  ? Basophils Absolute 53 0 - 200 cells/uL  ? Neutrophils Relative % 65 %  ? Total Lymphocyte 22.4 %  ? Monocytes Relative 6.5 %  ? Eosinophils Relative 5.1 %  ? Basophils Relative 1.0 %  ?Hemoglobin A1c  ?Result Value Ref Range  ? Hgb A1c  MFr Bld 5.5 <5.7 % of total Hgb  ? Mean Plasma Glucose 111 mg/dL  ? eAG (mmol/L) 6.2 mmol/L  ? ?   ?Assessment & Plan:  ? ?Problem List Items  Addressed This Visit   ? ? Primary insomnia  ?  Insomnia, episodic ?Usually primary etiology ?Improved with Ambien 26m in past ?Will review PDMP ?Re order Ambien today for 1-2 doses per week, sent to mail order. ? ?  ?  ? Relevant Medications  ? zolpidem (AMBIEN) 5 MG tablet  ? Osteoarthritis of multiple joints  ?  Stable chronic issue - improved on injections per ortho ?Episodic pain R knee medial, currently stable ?Chronic OA/DJD multiple joints knees, hands, feet, prior problems with shoulders. ?- Complicated by CKD limiting NSAID use ?- improved on max conservative therapy ?- Followed by [facility] Dr KMack Guise? ?Plan: ?[facility] injection series ?- Continue current plan Meloxicam 7.512m3-4 times a week can adjust as needed, ordered for daily 90 day but he can use accordingly ?- Continue Tramadol 50106mID - reviewed [place] CSRS PMP AWARE - notify when ready for next 3 month refill does not need to come in to office. ?- Continue Diclofenac topical gel 1-2 times daily ?- Continue inc dose Tylenol ? ?  ?  ? Elevated hemoglobin A1c  ?  Prior A1c in range ? ?Plan:  ?1. Not on any therapy currently  ?2. Encourage improved lifestyle - low carb, low sugar diet, reduce portion size, continue improving regular exercise ?  ?  ? Benign hypertension with CKD (chronic kidney disease), stage II - Primary  ?  Controlled HTN ?Reviewed outside readings ?Complication with CKD-II improved ? fluctuation of Cr ? ?  ?Plan:  ?1. Continue current BP regimen Amlodipine 5mg56mily, Lisinopril 40mg19mly ?2. Encourage improved lifestyle - low sodium diet, regular exercise ?3. Continue monitor BP outside office, bring readings to next visit, if persistently >140/90 or new symptoms notify office sooner ? ?  ?  ?  ?Meds ordered this encounter  ?Medications  ? zolpidem (AMBIEN) 5 MG tablet  ?  Sig: Take 1 tablet 1-2 times per week as needed for sleep, insomnia.  ?  Dispense:  24 tablet  ?  Refill:  2  ? ? ? ? ?Follow up plan: ?Return in about  6 months (around 07/28/2022) for 6 month fasting lab only the 1 week later Annual Physical. ? ?Future labs ordered for 07/27/22 ? ? ?AlexaNobie Putnam?[facility] ?4

## 2022-01-26 NOTE — Assessment & Plan Note (Signed)
Controlled HTN ?Reviewed outside readings ?Complication with CKD-II improved ? fluctuation of Cr ? ?  ?Plan:  ?1. Continue current BP regimen Amlodipine 5mg  daily, Lisinopril 40mg  daily ?2. Encourage improved lifestyle - low sodium diet, regular exercise ?3. Continue monitor BP outside office, bring readings to next visit, if persistently >140/90 or new symptoms notify office sooner ?

## 2022-01-26 NOTE — Patient Instructions (Addendum)
Thank you for coming to the office today. ? ?Try the Zolpidem 5mg  dose 1-2 times a week as needed, ordered it for 24 pills to [facility]. ? ?Keep on Tramadol as needed, 5 additional refills or total 6 month ? ?DUE for FASTING BLOOD WORK (no food or drink after midnight before the lab appointment, only water or coffee without cream/sugar on the morning of) ? ?SCHEDULE "Lab Only" visit in the morning at the clinic for lab draw in 6 MONTHS  ? ?- Make sure Lab Only appointment is at about 1 week before your next appointment, so that results will be available ? ?For Lab Results, once available within 2-3 days of blood draw, you can can log in to MyChart online to view your results and a brief explanation. Also, we can discuss results at next follow-up visit. ? ?Please schedule a Follow-up Appointment to: Return in about 6 months (around 07/28/2022) for 6 month fasting lab only the 1 week later Annual Physical. ? ?If you have any other questions or concerns, please feel free to call the office or send a message through MyChart. You may also schedule an earlier appointment if necessary. ? ?Additionally, you may be receiving a survey about your experience at our office within a few days to 1 week by e-mail or mail. We value your feedback. ? ?07/30/2022, DO ?[facility], [facility] ?

## 2022-01-26 NOTE — Assessment & Plan Note (Signed)
Insomnia, episodic ?Usually primary etiology ?Improved with Ambien 5mg  in past ?Will review PDMP ?Re order Ambien today for 1-2 doses per week, sent to mail order. ?

## 2022-05-07 ENCOUNTER — Other Ambulatory Visit: Payer: Self-pay | Admitting: Family Medicine

## 2022-05-07 DIAGNOSIS — N183 Chronic kidney disease, stage 3 unspecified: Secondary | ICD-10-CM

## 2022-05-08 NOTE — Telephone Encounter (Signed)
Last RF 07/21/22 #90 3 RF  Requested Prescriptions  Refused Prescriptions Disp Refills  . amLODipine (NORVASC) 5 MG tablet [Pharmacy Med Name: AMLODIPINE BESYLATE 5 MG Tablet] 90 tablet 3    Sig: TAKE 1 TABLET EVERY DAY     Cardiovascular: Calcium Channel Blockers 2 Passed - 05/07/2022  2:59 AM      Passed - Last BP in normal range    BP Readings from Last 1 Encounters:  01/26/22 (!) 130/58         Passed - Last Heart Rate in normal range    Pulse Readings from Last 1 Encounters:  01/26/22 60         Passed - Valid encounter within last 6 months    Recent Outpatient Visits          3 months ago Benign hypertension with CKD (chronic kidney disease), stage II   [facility] [place], Netta Neat, DO   9 months ago Annual physical exam   [facility] [place], Netta Neat, DO   1 year ago Benign hypertension with CKD (chronic kidney disease) stage III [facility])   [facility] Smitty Cords, DO   1 year ago Annual physical exam   [facility] Smitty Cords, DO   2 years ago Benign hypertension with CKD (chronic kidney disease) stage III [facility])   [facility] Althea Charon, Netta Neat, DO      Future Appointments            In 2 months Althea Charon, Netta Neat, DO [facility], [facility]

## 2022-05-26 ENCOUNTER — Other Ambulatory Visit: Payer: Self-pay | Admitting: Family Medicine

## 2022-05-26 DIAGNOSIS — N401 Enlarged prostate with lower urinary tract symptoms: Secondary | ICD-10-CM

## 2022-05-28 NOTE — Telephone Encounter (Signed)
Requested Prescriptions  Pending Prescriptions Disp Refills  . finasteride (PROSCAR) 5 MG tablet [Pharmacy Med Name: FINASTERIDE 5 MG Tablet] 90 tablet 0    Sig: TAKE 1 TABLET EVERY DAY     Urology: 5-alpha Reductase Inhibitors Passed - 05/26/2022  6:19 PM      Passed - PSA in normal range and within 360 days    PSA  Date Value Ref Range Status  07/14/2021 1.54 < OR = 4.00 ng/mL Final    Comment:    The total PSA value from this assay system is  standardized against the WHO standard. The test  result will be approximately 20% lower when compared  to the equimolar-standardized total PSA (Beckman  Coulter). Comparison of serial PSA results should be  interpreted with this fact in mind. . This test was performed using the Siemens  chemiluminescent method. Values obtained from  different assay methods cannot be used interchangeably. PSA levels, regardless of value, should not be interpreted as absolute evidence of the presence or absence of disease.   12/14/2014 2.4  Final   Prostate Specific Ag, Serum  Date Value Ref Range Status  10/26/2015 2.5 0.0 - 4.0 ng/mL Final    Comment:    Roche ECLIA methodology. According to the American Urological Association, Serum PSA should decrease and remain at undetectable levels after radical prostatectomy. The AUA defines biochemical recurrence as an initial PSA value 0.2 ng/mL or greater followed by a subsequent confirmatory PSA value 0.2 ng/mL or greater. Values obtained with different assay methods or kits cannot be used interchangeably. Results cannot be interpreted as absolute evidence of the presence or absence of malignant disease.          Passed - Valid encounter within last 12 months    Recent Outpatient Visits          4 months ago Benign hypertension with CKD (chronic kidney disease), stage II   [facility] [place], Netta Neat, DO   10 months ago Annual physical exam   [facility]  Smitty Cords, DO   1 year ago Benign hypertension with CKD (chronic kidney disease) stage III [facility])   [facility] Smitty Cords, DO   1 year ago Annual physical exam   [facility] Smitty Cords, DO   2 years ago Benign hypertension with CKD (chronic kidney disease) stage III [facility])   [facility] Smitty Cords, DO      Future Appointments            In 2 months Althea Charon, Netta Neat, DO [facility], [facility]

## 2022-06-29 ENCOUNTER — Other Ambulatory Visit: Payer: Self-pay | Admitting: Family Medicine

## 2022-06-29 DIAGNOSIS — G8929 Other chronic pain: Secondary | ICD-10-CM

## 2022-06-29 DIAGNOSIS — F5101 Primary insomnia: Secondary | ICD-10-CM

## 2022-06-29 DIAGNOSIS — M159 Polyosteoarthritis, unspecified: Secondary | ICD-10-CM

## 2022-07-01 ENCOUNTER — Other Ambulatory Visit: Payer: Self-pay | Admitting: Family Medicine

## 2022-07-01 DIAGNOSIS — I129 Hypertensive chronic kidney disease with stage 1 through stage 4 chronic kidney disease, or unspecified chronic kidney disease: Secondary | ICD-10-CM

## 2022-07-02 NOTE — Telephone Encounter (Signed)
Requested Prescriptions  Pending Prescriptions Disp Refills  . lisinopril (ZESTRIL) 40 MG tablet [Pharmacy Med Name: LISINOPRIL 40 MG Tablet] 90 tablet 0    Sig: TAKE 1 TABLET EVERY DAY     Cardiovascular:  ACE Inhibitors Failed - 07/01/2022  3:28 AM      Failed - Cr in normal range and within 180 days    Creat  Date Value Ref Range Status  07/14/2021 1.23 0.70 - 1.28 mg/dL Final         Failed - K in normal range and within 180 days    Potassium  Date Value Ref Range Status  07/14/2021 4.6 3.5 - 5.3 mmol/L Final         Passed - Patient is not pregnant      Passed - Last BP in normal range    BP Readings from Last 1 Encounters:  01/26/22 (!) 130/58         Passed - Valid encounter within last 6 months    Recent Outpatient Visits          5 months ago Benign hypertension with CKD (chronic kidney disease), stage II   [facility] Olin Hauser, DO   11 months ago Annual physical exam   [facility] [place], Devonne Doughty, DO   1 year ago Benign hypertension with CKD (chronic kidney disease) stage III [facility])   [place], DO   2 years ago Annual physical exam   [facility] Olin Hauser, DO   2 years ago Benign hypertension with CKD (chronic kidney disease) stage III [facility])   [facility] Olin Hauser, DO      Future Appointments            In 1 month [place], [facility], [facility]

## 2022-07-02 NOTE — Telephone Encounter (Signed)
Requested medications are due for refill today.  yes  Requested medications are on the active medications list.  yes  Last refill. Zolpidem 01/26/2022 #24 2 refills, Tramadol 01/10/2022 #120 5 refills   Future visit scheduled.   yes  Notes to clinic.  Medication refills are not delegated.    Requested Prescriptions  Pending Prescriptions Disp Refills   zolpidem (AMBIEN) 5 MG tablet [Pharmacy Med Name: ZOLPIDEM TARTRATE 5 MG Tablet] 24 tablet     Sig: TAKE 1 TABLET 1 TO 2 TIMES PER WEEK AS NEEDED FOR SLEEP (INSOMNIA)     Not Delegated - Psychiatry:  Anxiolytics/Hypnotics Failed - 06/29/2022 12:25 PM      Failed - This refill cannot be delegated      Failed - Urine Drug Screen completed in last 360 days      Passed - Valid encounter within last 6 months    Recent Outpatient Visits           5 months ago Benign hypertension with CKD (chronic kidney disease), stage II   [facility] Olin Hauser, DO   11 months ago Annual physical exam   [facility] [place], Devonne Doughty, DO   1 year ago Benign hypertension with CKD (chronic kidney disease) stage III ([place])   [place], Devonne Doughty, DO   2 years ago Annual physical exam   [facility] Olin Hauser, DO   2 years ago Benign hypertension with CKD (chronic kidney disease) stage III ([place])   [place], DO       Future Appointments             In 1 month [place], Devonne Doughty, DO [facility]             traMADol (ULTRAM) 50 MG tablet [Pharmacy Med Name: TRAMADOL HYDROCHLORIDE 50 MG Tablet] 120 tablet     Sig: TAKE 1 TABLET 4 TIMES DAILY AS NEEDED FOR MODERATE PAIN     Not Delegated - Analgesics:  Opioid Agonists Failed - 06/29/2022 12:25 PM      Failed - This refill cannot be delegated      Failed - Urine Drug Screen completed in last 360 days      Failed -  Valid encounter within last 3 months    Recent Outpatient Visits           5 months ago Benign hypertension with CKD (chronic kidney disease), stage II   [facility] Olin Hauser, DO   11 months ago Annual physical exam   [place], Devonne Doughty, DO   1 year ago Benign hypertension with CKD (chronic kidney disease) stage III [facility])   [place], DO   2 years ago Annual physical exam   [facility] Olin Hauser, DO   2 years ago Benign hypertension with CKD (chronic kidney disease) stage III [facility])   [facility] [place], Devonne Doughty, DO       Future Appointments             In 1 month Parks Ranger, Devonne Doughty, DO [facility], [facility]

## 2022-07-19 ENCOUNTER — Other Ambulatory Visit: Payer: Self-pay

## 2022-07-19 DIAGNOSIS — M159 Polyosteoarthritis, unspecified: Secondary | ICD-10-CM

## 2022-07-19 DIAGNOSIS — I129 Hypertensive chronic kidney disease with stage 1 through stage 4 chronic kidney disease, or unspecified chronic kidney disease: Secondary | ICD-10-CM

## 2022-07-19 DIAGNOSIS — R7309 Other abnormal glucose: Secondary | ICD-10-CM

## 2022-07-19 DIAGNOSIS — N138 Other obstructive and reflux uropathy: Secondary | ICD-10-CM

## 2022-07-19 DIAGNOSIS — Z0000 Encounter for general adult medical examination without abnormal findings: Secondary | ICD-10-CM

## 2022-07-19 DIAGNOSIS — E7800 Pure hypercholesterolemia, unspecified: Secondary | ICD-10-CM

## 2022-07-20 ENCOUNTER — Other Ambulatory Visit: Payer: Medicare HMO

## 2022-07-20 ENCOUNTER — Other Ambulatory Visit: Payer: Self-pay | Admitting: Family Medicine

## 2022-07-20 DIAGNOSIS — M159 Polyosteoarthritis, unspecified: Secondary | ICD-10-CM | POA: Diagnosis not present

## 2022-07-20 DIAGNOSIS — N182 Chronic kidney disease, stage 2 (mild): Secondary | ICD-10-CM | POA: Diagnosis not present

## 2022-07-20 DIAGNOSIS — R7309 Other abnormal glucose: Secondary | ICD-10-CM | POA: Diagnosis not present

## 2022-07-20 DIAGNOSIS — N138 Other obstructive and reflux uropathy: Secondary | ICD-10-CM | POA: Diagnosis not present

## 2022-07-20 DIAGNOSIS — N401 Enlarged prostate with lower urinary tract symptoms: Secondary | ICD-10-CM | POA: Diagnosis not present

## 2022-07-20 DIAGNOSIS — E7800 Pure hypercholesterolemia, unspecified: Secondary | ICD-10-CM | POA: Diagnosis not present

## 2022-07-20 DIAGNOSIS — I129 Hypertensive chronic kidney disease with stage 1 through stage 4 chronic kidney disease, or unspecified chronic kidney disease: Secondary | ICD-10-CM | POA: Diagnosis not present

## 2022-07-20 DIAGNOSIS — Z0000 Encounter for general adult medical examination without abnormal findings: Secondary | ICD-10-CM | POA: Diagnosis not present

## 2022-07-20 NOTE — Telephone Encounter (Signed)
Requested medication (s) are due for refill today: yes  Requested medication (s) are on the active medication list: yes  Last refill:  07/21/21 #90/3  Future visit scheduled: yes  Notes to clinic:  Unable to refill per protocol due to failed labs, no updated results.      Requested Prescriptions  Pending Prescriptions Disp Refills   lovastatin (MEVACOR) 20 MG tablet [Pharmacy Med Name: LOVASTATIN 20 MG Tablet] 90 tablet 10    Sig: TAKE 1 TABLET EVERY DAY     Cardiovascular:  Antilipid - Statins 2 Failed - 07/20/2022  5:30 AM      Failed - Cr in normal range and within 360 days    Creat  Date Value Ref Range Status  07/14/2021 1.23 0.70 - 1.28 mg/dL Final         Failed - Lipid Panel in normal range within the last 12 months    Cholesterol, Total  Date Value Ref Range Status  10/26/2015 224 (H) 100 - 199 mg/dL Final   Cholesterol  Date Value Ref Range Status  07/14/2021 175 <200 mg/dL Final   LDL Cholesterol (Calc)  Date Value Ref Range Status  07/14/2021 93 mg/dL (calc) Final    Comment:    Reference range: <100 . Desirable range <100 mg/dL for primary prevention;   <70 mg/dL for patients with CHD or diabetic patients  with > or = 2 CHD risk factors. Marland Kitchen LDL-C is now calculated using the Martin-Hopkins  calculation, which is a validated novel method providing  better accuracy than the Friedewald equation in the  estimation of LDL-C.  Cresenciano Genre et al. Annamaria Helling. 4709;628(36): 2061-2068  (http://education.[employer].com/faq/FAQ164)    HDL  Date Value Ref Range Status  07/14/2021 69 > OR = 40 mg/dL Final  10/26/2015 83 >39 mg/dL Final   Triglycerides  Date Value Ref Range Status  07/14/2021 43 <150 mg/dL Final         Passed - Patient is not pregnant      Passed - Valid encounter within last 12 months    Recent Outpatient Visits           5 months ago Benign hypertension with CKD (chronic kidney disease), stage II   [place], DO   12 months ago Annual physical exam   [facility] Olin Hauser, DO   1 year ago Benign hypertension with CKD (chronic kidney disease) stage III [facility])   [place], DO   2 years ago Annual physical exam   [place], DO   2 years ago Benign hypertension with CKD (chronic kidney disease) stage III [facility])   [facility] Olin Hauser, DO       Future Appointments             In 2 weeks Parks Ranger, Devonne Doughty, DO [facility], [facility]

## 2022-07-24 LAB — CBC WITH DIFFERENTIAL/PLATELET
Absolute Monocytes: 336 cells/uL (ref 200–950)
Basophils Absolute: 47 cells/uL (ref 0–200)
Basophils Relative: 0.8 %
Eosinophils Absolute: 277 cells/uL (ref 15–500)
Eosinophils Relative: 4.7 %
HCT: 43.1 % (ref 38.5–50.0)
Hemoglobin: 13.9 g/dL (ref 13.2–17.1)
Lymphs Abs: 1463 cells/uL (ref 850–3900)
MCH: 30.3 pg (ref 27.0–33.0)
MCHC: 32.3 g/dL (ref 32.0–36.0)
MCV: 94.1 fL (ref 80.0–100.0)
MPV: 10.5 fL (ref 7.5–12.5)
Monocytes Relative: 5.7 %
Neutro Abs: 3776 cells/uL (ref 1500–7800)
Neutrophils Relative %: 64 %
Platelets: 260 10*3/uL (ref 140–400)
RBC: 4.58 10*6/uL (ref 4.20–5.80)
RDW: 13.2 % (ref 11.0–15.0)
Total Lymphocyte: 24.8 %
WBC: 5.9 10*3/uL (ref 3.8–10.8)

## 2022-07-24 LAB — COMPLETE METABOLIC PANEL WITH GFR
AG Ratio: 1.8 (calc) (ref 1.0–2.5)
ALT: 13 U/L (ref 9–46)
AST: 13 U/L (ref 10–35)
Albumin: 4.4 g/dL (ref 3.6–5.1)
Alkaline phosphatase (APISO): 79 U/L (ref 35–144)
BUN/Creatinine Ratio: 21 (calc) (ref 6–22)
BUN: 27 mg/dL — ABNORMAL HIGH (ref 7–25)
CO2: 23 mmol/L (ref 20–32)
Calcium: 9.2 mg/dL (ref 8.6–10.3)
Chloride: 107 mmol/L (ref 98–110)
Creat: 1.29 mg/dL — ABNORMAL HIGH (ref 0.70–1.28)
Globulin: 2.4 g/dL (calc) (ref 1.9–3.7)
Glucose, Bld: 89 mg/dL (ref 65–99)
Potassium: 4 mmol/L (ref 3.5–5.3)
Sodium: 140 mmol/L (ref 135–146)
Total Bilirubin: 0.5 mg/dL (ref 0.2–1.2)
Total Protein: 6.8 g/dL (ref 6.1–8.1)
eGFR: 58 mL/min/{1.73_m2} — ABNORMAL LOW (ref >=60)

## 2022-07-24 LAB — HEMOGLOBIN A1C
Hgb A1c MFr Bld: 5.5 % of total Hgb (ref <5.7)
Mean Plasma Glucose: 111 mg/dL
eAG (mmol/L): 6.2 mmol/L

## 2022-07-24 LAB — LIPID PANEL
Cholesterol: 180 mg/dL (ref <200)
HDL: 76 mg/dL (ref >=40)
LDL Cholesterol (Calc): 90 mg/dL (calc)
Non-HDL Cholesterol (Calc): 104 mg/dL (calc) (ref <130)
Total CHOL/HDL Ratio: 2.4 (calc) (ref <5.0)
Triglycerides: 48 mg/dL (ref <150)

## 2022-07-24 LAB — PSA: PSA: 1.54 ng/mL (ref <=4.00)

## 2022-07-24 LAB — TSH: TSH: 1.01 m[IU]/L (ref 0.40–4.50)

## 2022-07-27 ENCOUNTER — Other Ambulatory Visit: Payer: Medicare HMO

## 2022-08-03 ENCOUNTER — Encounter: Payer: Self-pay | Admitting: Family Medicine

## 2022-08-03 ENCOUNTER — Ambulatory Visit (INDEPENDENT_AMBULATORY_CARE_PROVIDER_SITE_OTHER): Payer: Medicare HMO | Admitting: Family Medicine

## 2022-08-03 VITALS — BP 136/60 | HR 59 | Ht 70.0 in | Wt 160.2 lb

## 2022-08-03 DIAGNOSIS — I129 Hypertensive chronic kidney disease with stage 1 through stage 4 chronic kidney disease, or unspecified chronic kidney disease: Secondary | ICD-10-CM | POA: Diagnosis not present

## 2022-08-03 DIAGNOSIS — R7309 Other abnormal glucose: Secondary | ICD-10-CM | POA: Diagnosis not present

## 2022-08-03 DIAGNOSIS — N401 Enlarged prostate with lower urinary tract symptoms: Secondary | ICD-10-CM

## 2022-08-03 DIAGNOSIS — N183 Chronic kidney disease, stage 3 unspecified: Secondary | ICD-10-CM

## 2022-08-03 DIAGNOSIS — Z1211 Encounter for screening for malignant neoplasm of colon: Secondary | ICD-10-CM

## 2022-08-03 DIAGNOSIS — M159 Polyosteoarthritis, unspecified: Secondary | ICD-10-CM

## 2022-08-03 DIAGNOSIS — E7800 Pure hypercholesterolemia, unspecified: Secondary | ICD-10-CM

## 2022-08-03 DIAGNOSIS — N138 Other obstructive and reflux uropathy: Secondary | ICD-10-CM

## 2022-08-03 DIAGNOSIS — F5101 Primary insomnia: Secondary | ICD-10-CM

## 2022-08-03 DIAGNOSIS — G8929 Other chronic pain: Secondary | ICD-10-CM

## 2022-08-03 DIAGNOSIS — Z0000 Encounter for general adult medical examination without abnormal findings: Secondary | ICD-10-CM

## 2022-08-03 MED ORDER — AMLODIPINE BESYLATE 5 MG PO TABS: 5.0000 mg | ORAL_TABLET | Freq: Every day | ORAL | 3 refills | Status: DC

## 2022-08-03 MED ORDER — LISINOPRIL 40 MG PO TABS: 40.0000 mg | ORAL_TABLET | Freq: Every day | ORAL | 0 refills | Status: DC

## 2022-08-03 MED ORDER — FINASTERIDE 5 MG PO TABS: 5.0000 mg | ORAL_TABLET | Freq: Every day | ORAL | 0 refills | Status: DC

## 2022-08-03 MED ORDER — TRAMADOL HCL 50 MG PO TABS: 50.0000 mg | ORAL_TABLET | Freq: Four times a day (QID) | ORAL | 2 refills | Status: DC | PRN

## 2022-08-03 MED ORDER — ZOLPIDEM TARTRATE 5 MG PO TABS: ORAL_TABLET | ORAL | 2 refills | Status: DC

## 2022-08-03 MED ORDER — MELOXICAM 7.5 MG PO TABS: ORAL_TABLET | ORAL | 3 refills | Status: DC

## 2022-08-03 NOTE — Progress Notes (Signed)
Subjective:    Patient ID: Jerry Simmons, male    DOB: 1946/03/21, 76 y.o.   MRN: [id number]  Jerry Simmons is a 76 y.o. male presenting on 08/03/2022 for Annual Exam   HPI  Here for Annual Physical and Lab Review  Elevated A1c A1c improved to 5.5. Stable from previous. - Not taking medicine - Lifestyle - Diet: Improving diet, less sweets, more water - Exercise: active, walking - mowing yards 4 x week   Osteoarthritis, Multiple joints (knees, hands, feet) / Chronic Knee Pain Chronic problem with arthritis, last seen for same issue multiple times Followed by [facility] Dr Mack Guise Failed steroid injections Completed PT and Synvisc injections Some improvement, he has episodic flare ups Left Inner Knee  Continues to work with Dr Mack Guise - improved on injections On controlled subs agreement opoid for Tramadol, doing well, taking QID rarely takes a half or skip a dose. - Taking Meloxicam 7.5 PRN, still frequently but using more topical Diclofenac and Tylenol -Also taking Glucosamine 1564m and Chondroitin 12030mtwice daily Denies redness swelling worsening pain, numbness tingling weakness other joint problem.   CHRONIC HTN CKD II Reports no new concerns Current Meds - Amlodipine 46m73maily, Lisinopril 29m24mily Reports good compliance, took meds today. Tolerating well, w/o complaints. Denies CP, dyspnea, HA, edema, dizziness / lightheadedness   Insomnia, primary Improved on Ambien PRN, not taking nightly, needs new rx    Health Maintenance:   Flu Shot updated and RSV updated at pharmacy    Cologuard negative 06/26/19, will be due for repeat test in 1 year 2023 - ordered today  PSA negative     08/03/2022    9:33 AM 08/03/2022    9:05 AM 07/21/2021    2:44 PM  Depression screen PHQ 2/9  Decreased Interest 0 0 0  Down, Depressed, Hopeless 0 0 0  PHQ - 2 Score 0 0 0  Altered sleeping 0    Tired, decreased energy 0    Change in appetite 0    Feeling bad  or failure about yourself  0    Trouble concentrating 0    Moving slowly or fidgety/restless 0    Suicidal thoughts 0    PHQ-9 Score 0    Difficult doing work/chores Not difficult at all      Past Medical History:  Diagnosis Date   Arthritis    Past Surgical History:  Procedure Laterality Date   EYE SURGERY  12/10/2018   Right eye   Social History   Socioeconomic History   Marital status: Married    Spouse name: Not on file   Number of children: Not on file   Years of education: Not on file   Highest education level: Not on file  Occupational History   Not on file  Tobacco Use   Smoking status: Former    Packs/day: 3.00    Years: 20.00    Total pack years: 60.00    Types: Cigarettes    Quit date: 09/16/1985    Years since quitting: 36.9   Smokeless tobacco: Former  Substance and Sexual Activity   Alcohol use: Yes    Alcohol/week: 1.0 standard drink of alcohol    Types: 1 Glasses of wine per week    Comment: Only has wine on occassional.    Drug use: No   Sexual activity: Not on file  Other Topics Concern   Not on file  Social History Narrative   Not on file   Social  Determinants of Health   Financial Resource Strain: Not on file  Food Insecurity: Not on file  Transportation Needs: Not on file  Physical Activity: Not on file  Stress: Not on file  Social Connections: Not on file  Intimate Partner Violence: Not on file   History reviewed. No pertinent family history. Current Outpatient Medications on File Prior to Visit  Medication Sig   diclofenac sodium (VOLTAREN) 1 % GEL Apply 2 g topically 3 (three) times daily as needed. For knees and hands, arthritis   lovastatin (MEVACOR) 20 MG tablet TAKE 1 TABLET EVERY DAY   No current facility-administered medications on file prior to visit.    Review of Systems  Constitutional:  Negative for activity change, appetite change, chills, diaphoresis, fatigue and fever.  HENT:  Negative for congestion and hearing  loss.   Eyes:  Negative for visual disturbance.  Respiratory:  Negative for cough, chest tightness, shortness of breath and wheezing.   Cardiovascular:  Negative for chest pain, palpitations and leg swelling.  Gastrointestinal:  Negative for abdominal pain, constipation, diarrhea, nausea and vomiting.  Genitourinary:  Negative for dysuria, frequency and hematuria.  Musculoskeletal:  Negative for arthralgias and neck pain.  Skin:  Negative for rash.  Neurological:  Negative for dizziness, weakness, light-headedness, numbness and headaches.  Hematological:  Negative for adenopathy.  Psychiatric/Behavioral:  Negative for behavioral problems, dysphoric mood and sleep disturbance.    Per HPI unless specifically indicated above      Objective:    BP 136/60   Pulse (!) 59   Ht '5\' 10"'  (1.778 m)   Wt 160 lb 3.2 oz (72.7 kg)   SpO2 99%   BMI 22.99 kg/m   Wt Readings from Last 3 Encounters:  08/03/22 160 lb 3.2 oz (72.7 kg)  01/26/22 160 lb 6.4 oz (72.8 kg)  07/21/21 157 lb 6.4 oz (71.4 kg)    Physical Exam Vitals and nursing note reviewed.  Constitutional:      General: He is not in acute distress.    Appearance: He is well-developed. He is not diaphoretic.     Comments: Well-appearing, comfortable, cooperative  HENT:     Head: Normocephalic and atraumatic.  Eyes:     General:        Right eye: No discharge.        Left eye: No discharge.     Conjunctiva/sclera: Conjunctivae normal.     Pupils: Pupils are equal, round, and reactive to light.  Neck:     Thyroid: No thyromegaly.  Cardiovascular:     Rate and Rhythm: Normal rate and regular rhythm.     Pulses: Normal pulses.     Heart sounds: Normal heart sounds. No murmur heard. Pulmonary:     Effort: Pulmonary effort is normal. No respiratory distress.     Breath sounds: Normal breath sounds. No wheezing or rales.  Abdominal:     General: Bowel sounds are normal. There is no distension.     Palpations: Abdomen is soft.  There is no mass.     Tenderness: There is no abdominal tenderness.  Musculoskeletal:        General: No tenderness. Normal range of motion.     Cervical back: Normal range of motion and neck supple.     Comments: Upper / Lower Extremities: - Normal muscle tone, strength bilateral upper extremities 5/5, lower extremities 5/5  Lymphadenopathy:     Cervical: No cervical adenopathy.  Skin:    General: Skin is warm and  dry.     Findings: No erythema or rash.  Neurological:     Mental Status: He is alert and oriented to person, place, and time.     Comments: Distal sensation intact to light touch all extremities  Psychiatric:        Mood and Affect: Mood normal.        Behavior: Behavior normal.        Thought Content: Thought content normal.     Comments: Well groomed, good eye contact, normal speech and thoughts      Results for orders placed or performed in visit on 07/19/22  TSH  Result Value Ref Range   TSH 1.01 0.40 - 4.50 mIU/L  PSA  Result Value Ref Range   PSA 1.54 < OR = 4.00 ng/mL  Hemoglobin A1c  Result Value Ref Range   Hgb A1c MFr Bld 5.5 <5.7 % of total Hgb   Mean Plasma Glucose 111 mg/dL   eAG (mmol/L) 6.2 mmol/L  Lipid panel  Result Value Ref Range   Cholesterol 180 <200 mg/dL   HDL 76 > OR = 40 mg/dL   Triglycerides 48 <150 mg/dL   LDL Cholesterol (Calc) 90 mg/dL (calc)   Total CHOL/HDL Ratio 2.4 <5.0 (calc)   Non-HDL Cholesterol (Calc) 104 <130 mg/dL (calc)  CBC with Differential/Platelet  Result Value Ref Range   WBC 5.9 3.8 - 10.8 Thousand/uL   RBC 4.58 4.20 - 5.80 Million/uL   Hemoglobin 13.9 13.2 - 17.1 g/dL   HCT 43.1 38.5 - 50.0 %   MCV 94.1 80.0 - 100.0 fL   MCH 30.3 27.0 - 33.0 pg   MCHC 32.3 32.0 - 36.0 g/dL   RDW 13.2 11.0 - 15.0 %   Platelets 260 140 - 400 Thousand/uL   MPV 10.5 7.5 - 12.5 fL   Neutro Abs 3,776 1,500 - 7,800 cells/uL   Lymphs Abs 1,463 850 - 3,900 cells/uL   Absolute Monocytes 336 200 - 950 cells/uL   Eosinophils  Absolute 277 15 - 500 cells/uL   Basophils Absolute 47 0 - 200 cells/uL   Neutrophils Relative % 64.0 %   Total Lymphocyte 24.8 %   Monocytes Relative 5.7 %   Eosinophils Relative 4.7 %   Basophils Relative 0.8 %   Smear Review    COMPLETE METABOLIC PANEL WITH GFR  Result Value Ref Range   Glucose, Bld 89 65 - 99 mg/dL   BUN 27 (H) 7 - 25 mg/dL   Creat 1.29 (H) 0.70 - 1.28 mg/dL   eGFR 58 (L) > OR = 60 mL/min/1.41m   BUN/Creatinine Ratio 21 6 - 22 (calc)   Sodium 140 135 - 146 mmol/L   Potassium 4.0 3.5 - 5.3 mmol/L   Chloride 107 98 - 110 mmol/L   CO2 23 20 - 32 mmol/L   Calcium 9.2 8.6 - 10.3 mg/dL   Total Protein 6.8 6.1 - 8.1 g/dL   Albumin 4.4 3.6 - 5.1 g/dL   Globulin 2.4 1.9 - 3.7 g/dL (calc)   AG Ratio 1.8 1.0 - 2.5 (calc)   Total Bilirubin 0.5 0.2 - 1.2 mg/dL   Alkaline phosphatase (APISO) 79 35 - 144 U/L   AST 13 10 - 35 U/L   ALT 13 9 - 46 U/L      Assessment & Plan:   Problem List Items Addressed This Visit     Benign hypertension with CKD (chronic kidney disease) stage III (HCC)   Relevant Medications   amLODipine (  NORVASC) 5 MG tablet   lisinopril (ZESTRIL) 40 MG tablet   BPH with obstruction/lower urinary tract symptoms   Relevant Medications   finasteride (PROSCAR) 5 MG tablet   Chronic pain   Relevant Medications   traMADol (ULTRAM) 50 MG tablet   meloxicam (MOBIC) 7.5 MG tablet   Elevated hemoglobin A1c   Hypercholesteremia   Relevant Medications   amLODipine (NORVASC) 5 MG tablet   lisinopril (ZESTRIL) 40 MG tablet   Osteoarthritis of multiple joints   Relevant Medications   traMADol (ULTRAM) 50 MG tablet   meloxicam (MOBIC) 7.5 MG tablet   Primary insomnia   Relevant Medications   zolpidem (AMBIEN) 5 MG tablet   Other Visit Diagnoses     Annual physical exam    -  Primary   Screen for colon cancer       Relevant Orders   Cologuard       Updated Health Maintenance information Reviewed recent lab results with patient Encouraged  improvement to lifestyle with diet and exercise Goal of weight loss  Continue current course Reviewed Creatinine CKD status, stable Caution NSAIDs, using topical more  Maybe ask Ortho about injection for the finger, with arthritis  Refilled Tramadol and Zolpidem sleeping pill   Orders Placed This Encounter  Procedures   Cologuard     Meds ordered this encounter  Medications   zolpidem (AMBIEN) 5 MG tablet    Sig: TAKE 1 TABLET 1 TO 2 TIMES PER WEEK AS NEEDED FOR SLEEP (INSOMNIA)    Dispense:  30 tablet    Refill:  2   traMADol (ULTRAM) 50 MG tablet    Sig: Take 1 tablet (50 mg total) by mouth 4 (four) times daily as needed for moderate pain.    Dispense:  120 tablet    Refill:  2   amLODipine (NORVASC) 5 MG tablet    Sig: Take 1 tablet (5 mg total) by mouth daily.    Dispense:  90 tablet    Refill:  3    Not ready for fill, only add future refills   finasteride (PROSCAR) 5 MG tablet    Sig: Take 1 tablet (5 mg total) by mouth daily.    Dispense:  90 tablet    Refill:  0    Not ready for fill, only add future refills   lisinopril (ZESTRIL) 40 MG tablet    Sig: Take 1 tablet (40 mg total) by mouth daily.    Dispense:  90 tablet    Refill:  0    Not ready for fill, only add future refills   meloxicam (MOBIC) 7.5 MG tablet    Sig: TAKE 1 TABLET EVERY DAY AS NEEDED FOR PAIN    Dispense:  90 tablet    Refill:  3    Not ready for fill, only add future refills      Follow up plan: Return in about 6 months (around 02/02/2023) for 6 month follow-up HTN CKD.  Nobie Putnam, [facility] 08/03/2022, 8:55 AM

## 2022-08-03 NOTE — Patient Instructions (Addendum)
Thank you for coming to the office today.  Keep up the great work  Maybe ask Ortho about injection for the finger, with arthritis  Refilled Tramadol and Zolpidem sleeping pill    Ordered the Cologuard (home kit) test for colon cancer screening. Stay tuned for further updates.  It will be shipped to you directly. If not received in 2-4 weeks, call us or the company.   If you send it back and no results are received in 2-4 weeks, call us or the company as well!   Colon Cancer Screening: - For all adults age 87+ routine colon cancer screening is highly recommended.     - Recent guidelines from [place] recommend starting age of 68 - Early detection of colon cancer is important, because often there are no warning signs or symptoms, also if found early usually it can be cured. Late stage is hard to treat.   - If Cologuard is NEGATIVE, then it is good for 3 years before next due - If Cologuard is POSITIVE, then it is strongly advised to get a Colonoscopy, which allows the GI doctor to locate the source of the cancer or polyp (even very early stage) and treat it by removing it. ------------------------- Follow instructions to collect sample, you may call the company for any help or questions, 24/7 telephone support at [phone].   Please schedule a Follow-up Appointment to: Return in about 6 months (around 02/02/2023) for 6 month follow-up HTN CKD.  If you have any other questions or concerns, please feel free to call the office or send a message through [place]. You may also schedule an earlier appointment if necessary.  Additionally, you may be receiving a survey about your experience at our office within a few days to 1 week by e-mail or mail. We value your feedback.  Nobie Putnam, DO [place]

## 2022-08-10 DIAGNOSIS — Z1211 Encounter for screening for malignant neoplasm of colon: Secondary | ICD-10-CM | POA: Diagnosis not present

## 2022-08-23 LAB — COLOGUARD: COLOGUARD: NEGATIVE

## 2022-09-28 ENCOUNTER — Telehealth: Payer: Self-pay | Admitting: Family Medicine

## 2022-09-28 NOTE — Telephone Encounter (Signed)
Left message for patient to call back and schedule Medicare Annual Wellness Visit (AWV) to be done virtually or by telephone.  No hx of AWV eligible as of 12/14/10  Please schedule at anytime with [facility].        Any questions, please call me at [phone]

## 2022-11-05 DIAGNOSIS — M170 Bilateral primary osteoarthritis of knee: Secondary | ICD-10-CM | POA: Diagnosis not present

## 2022-11-06 ENCOUNTER — Encounter: Payer: Self-pay | Admitting: Family Medicine

## 2022-11-06 DIAGNOSIS — F5101 Primary insomnia: Secondary | ICD-10-CM

## 2022-11-06 MED ORDER — ZOLPIDEM TARTRATE 5 MG PO TABS: 2.5000 mg | ORAL_TABLET | Freq: Every day | ORAL | 1 refills | Status: DC

## 2022-11-10 ENCOUNTER — Other Ambulatory Visit: Payer: Self-pay | Admitting: Family Medicine

## 2022-11-10 DIAGNOSIS — M159 Polyosteoarthritis, unspecified: Secondary | ICD-10-CM

## 2022-11-10 DIAGNOSIS — G8929 Other chronic pain: Secondary | ICD-10-CM

## 2022-11-12 NOTE — Telephone Encounter (Signed)
Requested medication (s) are due for refill today - yes  Requested medication (s) are on the active medication list -yes  Future visit scheduled -yes  Last refill: 08/03/22 #120 2RF  Notes to clinic: non delegated Rx  Requested Prescriptions  Pending Prescriptions Disp Refills   traMADol (ULTRAM) 50 MG tablet [Pharmacy Med Name: TRAMADOL HYDROCHLORIDE 50 MG Tablet] 120 tablet     Sig: TAKE 1 TABLET FOUR TIMES DAILY AS NEEDED FOR MODERATE PAIN     Not Delegated - Analgesics:  Opioid Agonists Failed - 11/10/2022  2:55 PM      Failed - This refill cannot be delegated      Failed - Urine Drug Screen completed in last 360 days      Failed - Valid encounter within last 3 months    Recent Outpatient Visits           3 months ago Annual physical exam   [place], DO   9 months ago Benign hypertension with CKD (chronic kidney disease), stage II   [place], DO   1 year ago Annual physical exam   [facility] Olin Hauser, DO   1 year ago Benign hypertension with CKD (chronic kidney disease) stage III [facility])   [facility] Parks Ranger, Devonne Doughty, DO   2 years ago Annual physical exam   [place], DO       Future Appointments             In 2 months Parks [place], Devonne Doughty, DO [facility], [facility]               Requested Prescriptions  Pending Prescriptions Disp Refills   traMADol (ULTRAM) 50 MG tablet [Pharmacy Med Name: TRAMADOL HYDROCHLORIDE 50 MG Tablet] 120 tablet     Sig: TAKE 1 TABLET FOUR TIMES DAILY AS NEEDED FOR MODERATE PAIN     Not Delegated - Analgesics:  Opioid Agonists Failed - 11/10/2022  2:55 PM      Failed - This refill cannot be delegated      Failed - Urine Drug Screen completed in last 360 days       Failed - Valid encounter within last 3 months    Recent Outpatient Visits           3 months ago Annual physical exam   [place], DO   9 months ago Benign hypertension with CKD (chronic kidney disease), stage II   [place], DO   1 year ago Annual physical exam   [facility] Olin Hauser, DO   1 year ago Benign hypertension with CKD (chronic kidney disease) stage III [facility])   [facility] Olin Hauser, DO   2 years ago Annual physical exam   [place], DO       Future Appointments             In 2 months Parks Ranger, Devonne Doughty, DO [facility], [facility]

## 2022-12-23 ENCOUNTER — Other Ambulatory Visit: Payer: Self-pay | Admitting: Family Medicine

## 2022-12-23 DIAGNOSIS — N138 Other obstructive and reflux uropathy: Secondary | ICD-10-CM

## 2022-12-24 NOTE — Telephone Encounter (Signed)
Requested Prescriptions  Pending Prescriptions Disp Refills   finasteride (PROSCAR) 5 MG tablet [Pharmacy Med Name: FINASTERIDE 5 MG Tablet] 90 tablet 0    Sig: TAKE 1 TABLET EVERY DAY     Urology: 5-alpha Reductase Inhibitors Passed - 12/23/2022  4:54 AM      Passed - PSA in normal range and within 360 days    PSA  Date Value Ref Range Status  07/20/2022 1.54 < OR = 4.00 ng/mL Final    Comment:    The total PSA value from this assay system is  standardized against the WHO standard. The test  result will be approximately 20% lower when compared  to the equimolar-standardized total PSA (Beckman  Coulter). Comparison of serial PSA results should be  interpreted with this fact in mind. . This test was performed using the Siemens  chemiluminescent method. Values obtained from  different assay methods cannot be used interchangeably. PSA levels, regardless of value, should not be interpreted as absolute evidence of the presence or absence of disease.   12/14/2014 2.4  Final   Prostate Specific Ag, Serum  Date Value Ref Range Status  10/26/2015 2.5 0.0 - 4.0 ng/mL Final    Comment:    Roche ECLIA methodology. According to the American Urological Association, Serum PSA should decrease and remain at undetectable levels after radical prostatectomy. The AUA defines biochemical recurrence as an initial PSA value 0.2 ng/mL or greater followed by a subsequent confirmatory PSA value 0.2 ng/mL or greater. Values obtained with different assay methods or kits cannot be used interchangeably. Results cannot be interpreted as absolute evidence of the presence or absence of malignant disease.          Passed - Valid encounter within last 12 months    Recent Outpatient Visits           4 months ago Annual physical exam   [facility] [place], Devonne Doughty, DO   11 months ago Benign hypertension with CKD (chronic kidney disease), stage II   [place], DO   1 year ago Annual physical exam   [facility] Olin Hauser, DO   1 year ago Benign hypertension with CKD (chronic kidney disease) stage III [facility])   [facility] Olin Hauser, DO   2 years ago Annual physical exam   [facility] Olin Hauser, DO       Future Appointments             In 1 month Parks Ranger, Devonne Doughty, [facility], [facility]

## 2023-01-10 ENCOUNTER — Other Ambulatory Visit: Payer: Self-pay | Admitting: Family Medicine

## 2023-01-10 DIAGNOSIS — N183 Chronic kidney disease, stage 3 unspecified: Secondary | ICD-10-CM

## 2023-01-10 DIAGNOSIS — N401 Enlarged prostate with lower urinary tract symptoms: Secondary | ICD-10-CM

## 2023-01-10 NOTE — Telephone Encounter (Signed)
Requested medication (s) are due for refill today:   lisinopril yes,  Proscar No  Requested medication (s) are on the active medication list:   Yes for both  Future visit scheduled:   Yes 02/15/2023   Last ordered: Lisinopril 08/03/2022 #90, 0 refills,  Proscar 12/24/2022 #90, 0 refills  Returned because in the notes it's mentioned he needs labs before next refill.      Requested Prescriptions  Pending Prescriptions Disp Refills   lisinopril (ZESTRIL) 40 MG tablet [Pharmacy Med Name: LISINOPRIL 40 MG Tablet] 90 tablet 3    Sig: TAKE 1 TABLET EVERY DAY (NEED LABS FOR REFILLS)     Cardiovascular:  ACE Inhibitors Failed - 01/10/2023  2:45 AM      Failed - Cr in normal range and within 180 days    Creat  Date Value Ref Range Status  07/20/2022 1.29 (H) 0.70 - 1.28 mg/dL Final         Passed - K in normal range and within 180 days    Potassium  Date Value Ref Range Status  07/20/2022 4.0 3.5 - 5.3 mmol/L Final         Passed - Patient is not pregnant      Passed - Last BP in normal range    BP Readings from Last 1 Encounters:  08/03/22 136/60         Passed - Valid encounter within last 6 months    Recent Outpatient Visits           5 months ago Annual physical exam   [facility] [place], Devonne Doughty, DO   11 months ago Benign hypertension with CKD (chronic kidney disease), stage II   [place], DO   1 year ago Annual physical exam   [facility] O[facility] Hauser, DO   2 years ago Benign hypertension with CKD (chronic kidney disease) stage III [facility])   [facility], Devonne Doughty, DO   2 years ago Annual physical exam   [place], DO       Future Appointments             In 1 month [place], Devonne Doughty, DO [facility]              finasteride (PROSCAR) 5 MG tablet [Pharmacy Med Name: FINASTERIDE 5 MG Tablet] 90 tablet 3    Sig: TAKE 1 TABLET EVERY DAY     Urology: 5-alpha Reductase Inhibitors Passed - 01/10/2023  2:45 AM      Passed - PSA in normal range and within 360 days    PSA  Date Value Ref Range Status  07/20/2022 1.54 < OR = 4.00 ng/mL Final    Comment:    The total PSA value from this assay system is  standardized against the WHO standard. The test  result will be approximately 20% lower when compared  to the equimolar-standardized total PSA (Beckman  Coulter). Comparison of serial PSA results should be  interpreted with this fact in mind. . This test was performed using the Siemens  chemiluminescent method. Values obtained from  different assay methods cannot be used interchangeably. PSA levels, regardless of value, should not be interpreted as absolute evidence of the presence or absence of disease.   12/14/2014 2.4  Final   Prostate Specific  Ag, Serum  Date Value Ref Range Status  10/26/2015 2.5 0.0 - 4.0 ng/mL Final    Comment:    Roche ECLIA methodology. According to the American Urological Association, Serum PSA should decrease and remain at undetectable levels after radical prostatectomy. The AUA defines biochemical recurrence as an initial PSA value 0.2 ng/mL or greater followed by a subsequent confirmatory PSA value 0.2 ng/mL or greater. Values obtained with different assay methods or kits cannot be used interchangeably. Results cannot be interpreted as absolute evidence of the presence or absence of malignant disease.          Passed - Valid encounter within last 12 months    Recent Outpatient Visits           5 months ago Annual physical exam   [facility] [place], Devonne Doughty, DO   11 months ago Benign hypertension with CKD (chronic kidney disease), stage II   [facility] [place], DO    1 year ago Annual physical exam   [facility], DO   2 years ago Benign hypertension with CKD (chronic kidney disease) stage III [facility])   [facility], DO   2 years ago Annual physical exam   [facility], DO       Future Appointments             In 1 month [place], Devonne Doughty, [facility], [facility]

## 2023-01-25 ENCOUNTER — Ambulatory Visit: Payer: Medicare HMO | Admitting: Family Medicine

## 2023-02-01 ENCOUNTER — Ambulatory Visit: Payer: Medicare HMO | Admitting: Family Medicine

## 2023-02-01 ENCOUNTER — Other Ambulatory Visit: Payer: Self-pay | Admitting: Family Medicine

## 2023-02-01 DIAGNOSIS — N183 Chronic kidney disease, stage 3 unspecified: Secondary | ICD-10-CM

## 2023-02-01 NOTE — Telephone Encounter (Signed)
Refilled 01/10/23 #90 with 3 refills. Requested Prescriptions  Refused Prescriptions Disp Refills   lisinopril (ZESTRIL) 40 MG tablet [Pharmacy Med Name: LISINOPRIL 40 MG Tablet] 90 tablet 3    Sig: TAKE 1 TABLET EVERY DAY     Cardiovascular:  ACE Inhibitors Failed - 02/01/2023  3:12 AM      Failed - Cr in normal range and within 180 days    Creat  Date Value Ref Range Status  07/20/2022 1.29 (H) 0.70 - 1.28 mg/dL Final         Failed - K in normal range and within 180 days    Potassium  Date Value Ref Range Status  07/20/2022 4.0 3.5 - 5.3 mmol/L Final         Failed - Valid encounter within last 6 months    Recent Outpatient Visits           6 months ago Annual physical exam   [place] [facility] Smitty Cords, DO   1 year ago Benign hypertension with CKD (chronic kidney disease), stage II   [facility] [place], Netta Neat, DO   1 year ago Annual physical exam   [place] [facility] Smitty Cords, DO   2 years ago Benign hypertension with CKD (chronic kidney disease) stage III [facility])   [place] [facility] Smitty Cords, DO   2 years ago Annual physical exam   [facility] Smitty Cords, DO       Future Appointments             In 2 weeks Althea Charon Netta Neat, DO  [facility], [facility]            Passed - Patient is not pregnant      Passed - Last BP in normal range    BP Readings from Last 1 Encounters:  08/03/22 136/60

## 2023-02-11 ENCOUNTER — Other Ambulatory Visit: Payer: Self-pay | Admitting: Family Medicine

## 2023-02-11 DIAGNOSIS — F5101 Primary insomnia: Secondary | ICD-10-CM

## 2023-02-12 NOTE — Telephone Encounter (Signed)
Requested medication (s) are due for refill today: yes  Requested medication (s) are on the active medication list: yes  Last refill:  11/06/22  Future visit scheduled: yes  Notes to clinic:  Unable to refill per protocol, cannot delegate.      Requested Prescriptions  Pending Prescriptions Disp Refills   zolpidem (AMBIEN) 5 MG tablet [Pharmacy Med Name: ZOLPIDEM TARTRATE 5 MG Tablet] 45 tablet     Sig: TAKE 1/2 TABLET AT BEDTIME     Not Delegated - Psychiatry:  Anxiolytics/Hypnotics Failed - 02/11/2023  7:43 AM      Failed - This refill cannot be delegated      Failed - Urine Drug Screen completed in last 360 days      Failed - Valid encounter within last 6 months    Recent Outpatient Visits           6 months ago Annual physical exam   [place] [facility] Smitty Cords, DO   1 year ago Benign hypertension with CKD (chronic kidney disease), stage II   [place] [facility], Netta Neat, DO   1 year ago Annual physical exam   [place] [facility] Smitty Cords, DO   2 years ago Benign hypertension with CKD (chronic kidney disease) stage III [facility])   [place] [facility] Smitty Cords, DO   2 years ago Annual physical exam   [place] [facility] Althea Charon, Netta Neat, DO       Future Appointments             In 3 days Althea Charon, Netta Neat, DO [place] [facility]

## 2023-02-15 ENCOUNTER — Ambulatory Visit (INDEPENDENT_AMBULATORY_CARE_PROVIDER_SITE_OTHER): Payer: Medicare HMO | Admitting: Family Medicine

## 2023-02-15 ENCOUNTER — Other Ambulatory Visit: Payer: Self-pay | Admitting: Family Medicine

## 2023-02-15 ENCOUNTER — Encounter: Payer: Self-pay | Admitting: Family Medicine

## 2023-02-15 VITALS — BP 116/68 | HR 62 | Ht 70.0 in | Wt 155.0 lb

## 2023-02-15 DIAGNOSIS — R7309 Other abnormal glucose: Secondary | ICD-10-CM

## 2023-02-15 DIAGNOSIS — I129 Hypertensive chronic kidney disease with stage 1 through stage 4 chronic kidney disease, or unspecified chronic kidney disease: Secondary | ICD-10-CM

## 2023-02-15 DIAGNOSIS — G8929 Other chronic pain: Secondary | ICD-10-CM | POA: Diagnosis not present

## 2023-02-15 DIAGNOSIS — E7800 Pure hypercholesterolemia, unspecified: Secondary | ICD-10-CM

## 2023-02-15 DIAGNOSIS — N183 Chronic kidney disease, stage 3 unspecified: Secondary | ICD-10-CM

## 2023-02-15 DIAGNOSIS — F5101 Primary insomnia: Secondary | ICD-10-CM | POA: Diagnosis not present

## 2023-02-15 DIAGNOSIS — M159 Polyosteoarthritis, unspecified: Secondary | ICD-10-CM

## 2023-02-15 DIAGNOSIS — N138 Other obstructive and reflux uropathy: Secondary | ICD-10-CM | POA: Diagnosis not present

## 2023-02-15 DIAGNOSIS — N401 Enlarged prostate with lower urinary tract symptoms: Secondary | ICD-10-CM | POA: Diagnosis not present

## 2023-02-15 DIAGNOSIS — Z0000 Encounter for general adult medical examination without abnormal findings: Secondary | ICD-10-CM

## 2023-02-15 NOTE — Patient Instructions (Signed)
Thank you for coming to the office today.  Keep doing what you're doing!  Refills reviewed. You should have plenty on the Ambien half dose 2.5mg  and the Tramadol will be renewed in next 1-3 months approximately. Just let me know when ready.  Other medications will be due in October 2024.  DUE for FASTING BLOOD WORK (no food or drink after midnight before the lab appointment, only water or coffee without cream/sugar on the morning of)  SCHEDULE "Lab Only" visit in the morning at the clinic for lab draw in 5.5 MONTHS   - Make sure Lab Only appointment is at about 1 week before your next appointment, so that results will be available  For Lab Results, once available within 2-3 days of blood draw, you can can log in to MyChart online to view your results and a brief explanation. Also, we can discuss results at next follow-up visit.   Please schedule a Follow-up Appointment to: Return in about 25 weeks (around 08/09/2023) for 5.5 months fasting lab only then 1 week later Annual Physical on 08/09/23.  If you have any other questions or concerns, please feel free to call the office or send a message through MyChart. You may also schedule an earlier appointment if necessary.  Additionally, you may be receiving a survey about your experience at our office within a few days to 1 week by e-mail or mail. We value your feedback.  Saralyn Pilar, DO [facility], [place]

## 2023-02-15 NOTE — Assessment & Plan Note (Signed)
Chronic BPH Stable on Finasteride Off Flomax Stable PSA 1.5 prior lab.

## 2023-02-15 NOTE — Assessment & Plan Note (Signed)
Controlled HTN ?Reviewed outside readings ?Complication with CKD-II improved ? fluctuation of Cr ? ?  ?Plan:  ?1. Continue current BP regimen Amlodipine 5mg daily, Lisinopril 40mg daily ?2. Encourage improved lifestyle - low sodium diet, regular exercise ?3. Continue monitor BP outside office, bring readings to next visit, if persistently >140/90 or new symptoms notify office sooner ?

## 2023-02-15 NOTE — Progress Notes (Signed)
Subjective:    Patient ID: Jerry Simmons, male    DOB: 1946/03/30, 77 y.o.   MRN: [id number]  Jerry Simmons is a 77 y.o. male presenting on 02/15/2023 for Medical Management of Chronic Issues   HPI   Elevated A1c Last lab A1c improved to 5.5 - Not taking medicine - Lifestyle - Diet: Improving diet, less sweets, more water - Exercise: active, walking - mowing yards 4 x week   Osteoarthritis, Multiple joints (knees, hands, feet) / Chronic Knee Pain Chronic problem with arthritis, last seen for same issue multiple times Followed by [facility] Dr [name] Failed steroid injections Completed PT and Synvisc injections Some improvement, he has episodic flare ups Left Inner Knee  Continues to work with Dr [name] - improved on injections On controlled subs agreement opoid for Tramadol, doing well, taking QID rarely takes a half or skip a dose. - Taking Meloxicam 7.5 PRN, still frequently but using more topical Diclofenac and Tylenol -Also taking Glucosamine 1500mg  and Chondroitin 1200mg  twice daily Denies redness swelling worsening pain, numbness tingling weakness other joint problem.   CHRONIC HTN CKD II Reports no new concerns Current Meds - Amlodipine 5mg  daily, Lisinopril 40mg  daily Reports good compliance, took meds today. Tolerating well, w/o complaints. Denies CP, dyspnea, HA, edema, dizziness / lightheadedness   Insomnia, primary Improved on Ambien half dose of 5mg  now 2.5mg  dose nightly AS NEEDED        02/15/2023    3:38 PM 08/03/2022    9:33 AM 08/03/2022    9:05 AM  Depression screen PHQ 2/9  Decreased Interest 0 0 0  Down, Depressed, Hopeless 0 0 0  PHQ - 2 Score 0 0 0  Altered sleeping  0   Tired, decreased energy  0   Change in appetite  0   Feeling bad or failure about yourself   0   Trouble concentrating  0   Moving slowly or fidgety/restless  0   Suicidal thoughts  0   PHQ-9 Score  0   Difficult doing work/chores  Not difficult at all      Past Medical History:  Diagnosis Date   Arthritis    Past Surgical History:  Procedure Laterality Date   EYE SURGERY  12/10/2018   Right eye   Social History   Socioeconomic History   Marital status: Married    Spouse name: Not on file   Number of children: Not on file   Years of education: Not on file   Highest education level: Not on file  Occupational History   Not on file  Tobacco Use   Smoking status: Former    Packs/day: 3.00    Years: 20.00    Additional pack years: 0.00    Total pack years: 60.00    Types: Cigarettes    Quit date: 09/16/1985    Years since quitting: 37.4   Smokeless tobacco: Former  Substance and Sexual Activity   Alcohol use: Yes    Alcohol/week: 1.0 standard drink of alcohol    Types: 1 Glasses of wine per week    Comment: Only has wine on occassional.    Drug use: No   Sexual activity: Not on file  Other Topics Concern   Not on file  Social History Narrative   Not on file   Social Determinants of Health   Financial Resource Strain: Not on file  Food Insecurity: Not on file  Transportation Needs: Not on file  Physical Activity: Not on file  Stress: Not on file  Social Connections: Not on file  Intimate Partner Violence: Not on file   History reviewed. No pertinent family history. Current Outpatient Medications on File Prior to Visit  Medication Sig   amLODipine (NORVASC) 5 MG tablet Take 1 tablet (5 mg total) by mouth daily.   diclofenac sodium (VOLTAREN) 1 % GEL Apply 2 g topically 3 (three) times daily as needed. For knees and hands, arthritis   finasteride (PROSCAR) 5 MG tablet TAKE 1 TABLET EVERY DAY   lisinopril (ZESTRIL) 40 MG tablet TAKE 1 TABLET EVERY DAY (NEED LABS FOR REFILLS)   lovastatin (MEVACOR) 20 MG tablet TAKE 1 TABLET EVERY DAY   meloxicam (MOBIC) 7.5 MG tablet TAKE 1 TABLET EVERY DAY AS NEEDED FOR PAIN   traMADol (ULTRAM) 50 MG tablet Take 1 tablet (50 mg total) by mouth 4 (four) times daily as needed for  moderate pain or severe pain.   zolpidem (AMBIEN) 5 MG tablet TAKE 1/2 TABLET AT BEDTIME   No current facility-administered medications on file prior to visit.    Review of Systems Per HPI unless specifically indicated above      Objective:    BP 116/68   Pulse 62   Ht 5\' 10"  (1.778 m)   Wt 155 lb (70.3 kg)   SpO2 96%   BMI 22.24 kg/m   Wt Readings from Last 3 Encounters:  02/15/23 155 lb (70.3 kg)  08/03/22 160 lb 3.2 oz (72.7 kg)  01/26/22 160 lb 6.4 oz (72.8 kg)    Physical Exam Vitals and nursing note reviewed.  Constitutional:      General: He is not in acute distress.    Appearance: He is well-developed. He is not diaphoretic.     Comments: Well-appearing, comfortable, cooperative  HENT:     Head: Normocephalic and atraumatic.  Eyes:     General:        Right eye: No discharge.        Left eye: No discharge.     Conjunctiva/sclera: Conjunctivae normal.  Neck:     Thyroid: No thyromegaly.  Cardiovascular:     Rate and Rhythm: Normal rate and regular rhythm.     Pulses: Normal pulses.     Heart sounds: Normal heart sounds. No murmur heard. Pulmonary:     Effort: Pulmonary effort is normal. No respiratory distress.     Breath sounds: Normal breath sounds. No wheezing or rales.  Musculoskeletal:        General: Normal range of motion.     Cervical back: Normal range of motion and neck supple.  Lymphadenopathy:     Cervical: No cervical adenopathy.  Skin:    General: Skin is warm and dry.     Findings: No erythema or rash.  Neurological:     Mental Status: He is alert and oriented to person, place, and time. Mental status is at baseline.  Psychiatric:        Behavior: Behavior normal.     Comments: Well groomed, good eye contact, normal speech and thoughts      Results for orders placed or performed in visit on 08/03/22  Cologuard  Result Value Ref Range   COLOGUARD Negative Negative   Recent Labs    07/20/22 0809  HGBA1C 5.5        Assessment & Plan:   Problem List Items Addressed This Visit     Benign hypertension with CKD (chronic kidney disease) stage III (HCC) - Primary  Controlled HTN Reviewed outside readings Complication with CKD-II improved  fluctuation of Cr    Plan:  1. Continue current BP regimen Amlodipine 5mg  daily, Lisinopril 40mg  daily 2. Encourage improved lifestyle - low sodium diet, regular exercise 3. Continue monitor BP outside office, bring readings to next visit, if persistently >140/90 or new symptoms notify office sooner      BPH with obstruction/lower urinary tract symptoms    Chronic BPH Stable on Finasteride Off Flomax Stable PSA 1.5 prior lab.      Chronic pain   Elevated hemoglobin A1c    Prior A1c in range A1c 6.5  Plan:  1. Not on any therapy currently 2. Encourage improved lifestyle - low carb, low sugar diet, reduce portion size, continue improving regular exercise      Hypercholesteremia   Osteoarthritis of multiple joints    Stable chronic issue - improved on injections per ortho Episodic pain R knee medial, currently stable Chronic OA/DJD multiple joints knees, hands, feet, prior problems with shoulders. - Complicated by CKD limiting NSAID use - improved on max conservative therapy - Followed by [facility] Dr [name]  Plan: [facility] injection series - Continue current plan Meloxicam 7.5mg  3-4 times a week can adjust as needed, ordered for daily 90 day but he can use accordingly - Continue Tramadol 50mg  QID - reviewed, not due yet for refills. Will submit when ready - Continue Diclofenac topical gel 1-2 times daily - Continue inc dose Tylenol      Primary insomnia    Insomnia, episodic Usually primary etiology Continue on Ambien 2.5mg  nightly AS NEEDED, half dose of 5mg  has refills         No orders of the defined types were placed in this encounter.     Follow up plan: Return in about 25 weeks (around 08/09/2023) for 5.5 months  fasting lab only then 1 week later Annual Physical on 08/09/23.   Future Labs 07/2023  Saralyn Pilar, DO [facility] 02/15/2023, 3:51 PM

## 2023-02-15 NOTE — Assessment & Plan Note (Signed)
Insomnia, episodic Usually primary etiology Continue on Ambien 2.5mg  nightly AS NEEDED, half dose of 5mg  has refills

## 2023-02-15 NOTE — Assessment & Plan Note (Signed)
Stable chronic issue - improved on injections per ortho Episodic pain R knee medial, currently stable Chronic OA/DJD multiple joints knees, hands, feet, prior problems with shoulders. - Complicated by CKD limiting NSAID use - improved on max conservative therapy - Followed by [facility] Dr [name]  Plan: [facility] injection series - Continue current plan Meloxicam 7.5mg  3-4 times a week can adjust as needed, ordered for daily 90 day but he can use accordingly - Continue Tramadol 50mg  QID - reviewed, not due yet for refills. Will submit when ready - Continue Diclofenac topical gel 1-2 times daily - Continue inc dose Tylenol

## 2023-02-15 NOTE — Assessment & Plan Note (Signed)
Prior A1c in range A1c 6.5  Plan:  1. Not on any therapy currently 2. Encourage improved lifestyle - low carb, low sugar diet, reduce portion size, continue improving regular exercise

## 2023-05-04 ENCOUNTER — Other Ambulatory Visit: Payer: Self-pay | Admitting: Family Medicine

## 2023-05-04 DIAGNOSIS — M159 Polyosteoarthritis, unspecified: Secondary | ICD-10-CM

## 2023-05-04 DIAGNOSIS — G8929 Other chronic pain: Secondary | ICD-10-CM

## 2023-05-06 NOTE — Telephone Encounter (Signed)
Requested medication (s) are due for refill today: yes  Requested medication (s) are on the active medication list: yes  Last refill:  11/12/22  Future visit scheduled: yes  Notes to clinic:  Unable to refill per protocol, cannot delegate.      Requested Prescriptions  Pending Prescriptions Disp Refills   traMADol (ULTRAM) 50 MG tablet [Pharmacy Med Name: TRAMADOL HYDROCHLORIDE 50 MG Tablet] 120 tablet     Sig: TAKE 1 TABLET FOUR TIMES DAILY AS NEEDED FOR MODERATE PAIN OR SEVERE PAIN     Not Delegated - Analgesics:  Opioid Agonists Failed - 05/04/2023  8:35 AM      Failed - This refill cannot be delegated      Failed - Urine Drug Screen completed in last 360 days      Passed - Valid encounter within last 3 months    Recent Outpatient Visits           2 months ago Benign hypertension with CKD (chronic kidney disease) stage III [facility])   [place] [facility] [place], Netta Neat, DO   9 months ago Annual physical exam   [place] [facility] Smitty Cords, DO   1 year ago Benign hypertension with CKD (chronic kidney disease), stage II   [place] [facility] Smitty Cords, DO   1 year ago Annual physical exam   [place] [facility] Smitty Cords, DO   2 years ago Benign hypertension with CKD (chronic kidney disease) stage III [facility])   [place] [facility] [place], Netta Neat, DO       Future Appointments             In 3 months Althea Charon, Netta Neat, DO [place] [facility], [facility]

## 2023-05-27 ENCOUNTER — Telehealth: Payer: Self-pay | Admitting: Family Medicine

## 2023-05-27 NOTE — Telephone Encounter (Signed)
LM 05/27/2023 to schedule AWV   Verlee Rossetti; Care Guide Ambulatory Clinical Support [place] [facility] Direct Dial: [phone]

## 2023-06-02 ENCOUNTER — Other Ambulatory Visit: Payer: Self-pay | Admitting: Internal Medicine

## 2023-06-02 DIAGNOSIS — M159 Polyosteoarthritis, unspecified: Secondary | ICD-10-CM

## 2023-06-02 DIAGNOSIS — G8929 Other chronic pain: Secondary | ICD-10-CM

## 2023-06-04 NOTE — Telephone Encounter (Signed)
Requested medication (s) are due for refill today - yes  Requested medication (s) are on the active medication list -yes  Future visit scheduled -yes  Last refill: 05/06/23 #120  Notes to clinic: non delegated Rx  Requested Prescriptions  Pending Prescriptions Disp Refills   traMADol (ULTRAM) 50 MG tablet [Pharmacy Med Name: TRAMADOL HYDROCHLORIDE 50 MG Tablet] 120 tablet     Sig: TAKE 1 TABLET FOUR TIMES DAILY AS NEEDED FOR MODERATE PAIN OR SEVERE PAIN     Not Delegated - Analgesics:  Opioid Agonists Failed - 06/02/2023  5:44 PM      Failed - This refill cannot be delegated      Failed - Urine Drug Screen completed in last 360 days      Failed - Valid encounter within last 3 months    Recent Outpatient Visits           3 months ago Benign hypertension with CKD (chronic kidney disease) stage III [facility])   [place] [facility] Smitty Cords, DO   10 months ago Annual physical exam   [place] [facility] Smitty Cords, DO   1 year ago Benign hypertension with CKD (chronic kidney disease), stage II   [place] [facility] [place], Netta Neat, DO   1 year ago Annual physical exam   [place] [facility] Smitty Cords, DO   2 years ago Benign hypertension with CKD (chronic kidney disease) stage III [facility])   [place] [facility] [place], Netta Neat, DO       Future Appointments             In 2 months Althea Charon, Netta Neat, DO [place] [facility]               Requested Prescriptions  Pending Prescriptions Disp Refills   traMADol (ULTRAM) 50 MG tablet [Pharmacy Med Name: TRAMADOL HYDROCHLORIDE 50 MG Tablet] 120 tablet     Sig: TAKE 1 TABLET FOUR TIMES DAILY AS NEEDED FOR MODERATE PAIN OR SEVERE PAIN     Not Delegated - Analgesics:  Opioid Agonists Failed - 06/02/2023  5:44 PM      Failed - This refill cannot  be delegated      Failed - Urine Drug Screen completed in last 360 days      Failed - Valid encounter within last 3 months    Recent Outpatient Visits           3 months ago Benign hypertension with CKD (chronic kidney disease) stage III [facility])   [place] [facility] Smitty Cords, DO   10 months ago Annual physical exam   [place] [facility] Smitty Cords, DO   1 year ago Benign hypertension with CKD (chronic kidney disease), stage II   [place] [facility] [place], Netta Neat, DO   1 year ago Annual physical exam   [place] [facility] Smitty Cords, DO   2 years ago Benign hypertension with CKD (chronic kidney disease) stage III [facility])   [place] [facility] [place], Netta Neat, DO       Future Appointments             In 2 months Althea Charon, Netta Neat, DO  [facility], [facility]

## 2023-07-01 ENCOUNTER — Telehealth: Payer: Self-pay | Admitting: Family Medicine

## 2023-07-01 NOTE — Telephone Encounter (Signed)
LM 07/01/2023 to schedule AWV   Verlee Rossetti; Care Guide Ambulatory Clinical Support [place] [facility] Direct Dial: [phone]

## 2023-07-07 ENCOUNTER — Other Ambulatory Visit: Payer: Self-pay | Admitting: Family Medicine

## 2023-07-07 DIAGNOSIS — M159 Polyosteoarthritis, unspecified: Secondary | ICD-10-CM

## 2023-07-09 NOTE — Telephone Encounter (Signed)
Requested medication (s) are due for refill today: Yes  Requested medication (s) are on the active medication list: Yes  Last refill:  08/03/22  Future visit scheduled: Yes  Notes to clinic:  Manual review.    Requested Prescriptions  Pending Prescriptions Disp Refills   meloxicam (MOBIC) 7.5 MG tablet [Pharmacy Med Name: Meloxicam Oral Tablet 7.5 MG] 90 tablet 3    Sig: TAKE 1 TABLET EVERY DAY AS NEEDED FOR PAIN     Analgesics:  COX2 Inhibitors Failed - 07/07/2023 10:12 AM      Failed - Manual Review: Labs are only required if the patient has taken medication for more than 8 weeks.      Failed - Cr in normal range and within 360 days    Creat  Date Value Ref Range Status  07/20/2022 1.29 (H) 0.70 - 1.28 mg/dL Final         Passed - HGB in normal range and within 360 days    Hemoglobin  Date Value Ref Range Status  07/20/2022 13.9 13.2 - 17.1 g/dL Final  13/05/6577 46.9 12.6 - 17.7 g/dL Final         Passed - HCT in normal range and within 360 days    HCT  Date Value Ref Range Status  07/20/2022 43.1 38.5 - 50.0 % Final   Hematocrit  Date Value Ref Range Status  10/26/2015 43.0 37.5 - 51.0 % Final         Passed - AST in normal range and within 360 days    AST  Date Value Ref Range Status  07/20/2022 13 10 - 35 U/L Final         Passed - ALT in normal range and within 360 days    ALT  Date Value Ref Range Status  07/20/2022 13 9 - 46 U/L Final         Passed - eGFR is 30 or above and within 360 days    GFR, Est African American  Date Value Ref Range Status  06/24/2020 63 > OR = 60 mL/min/1.25m2 Final   GFR, Est Non African American  Date Value Ref Range Status  06/24/2020 54 (L) > OR = 60 mL/min/1.77m2 Final   eGFR  Date Value Ref Range Status  07/20/2022 58 (L) > OR = 60 mL/min/1.11m2 Final         Passed - Patient is not pregnant      Passed - Valid encounter within last 12 months    Recent Outpatient Visits           4 months ago Benign  hypertension with CKD (chronic kidney disease) stage III [facility])   [place] [facility] Smitty Cords, DO   11 months ago Annual physical exam   [place] [facility] Smitty Cords, DO   1 year ago Benign hypertension with CKD (chronic kidney disease), stage II   [facility] [place], Netta Neat, DO   1 year ago Annual physical exam   [place] [facility] Smitty Cords, DO   2 years ago Benign hypertension with CKD (chronic kidney disease) stage III [facility])   [place] [facility] [place], Netta Neat, DO       Future Appointments             In 1 month Althea Charon, Netta Neat, DO [place] [facility], [facility]

## 2023-07-21 ENCOUNTER — Other Ambulatory Visit: Payer: Self-pay | Admitting: Family Medicine

## 2023-07-21 DIAGNOSIS — E7800 Pure hypercholesterolemia, unspecified: Secondary | ICD-10-CM

## 2023-07-22 NOTE — Telephone Encounter (Signed)
Requested medications are due for refill today.  yes  Requested medications are on the active medications list.  yes  Last refill. 07/20/2022 #90 3rf  Future visit scheduled.   yes  Notes to clinic.  Labs are expired.    Requested Prescriptions  Pending Prescriptions Disp Refills   lovastatin (MEVACOR) 20 MG tablet [Pharmacy Med Name: Lovastatin Oral Tablet 20 MG] 90 tablet 3    Sig: TAKE 1 TABLET EVERY DAY     Cardiovascular:  Antilipid - Statins 2 Failed - 07/21/2023  7:32 AM      Failed - Cr in normal range and within 360 days    Creat  Date Value Ref Range Status  07/20/2022 1.29 (H) 0.70 - 1.28 mg/dL Final         Failed - Lipid Panel in normal range within the last 12 months    Cholesterol, Total  Date Value Ref Range Status  10/26/2015 224 (H) 100 - 199 mg/dL Final   Cholesterol  Date Value Ref Range Status  07/20/2022 180 <200 mg/dL Final   LDL Cholesterol (Calc)  Date Value Ref Range Status  07/20/2022 90 mg/dL (calc) Final    Comment:    Reference range: <100 . Desirable range <100 mg/dL for primary prevention;   <70 mg/dL for patients with CHD or diabetic patients  with > or = 2 CHD risk factors. Marland Kitchen LDL-C is now calculated using the Martin-Hopkins  calculation, which is a validated novel method providing  better accuracy than the Friedewald equation in the  estimation of LDL-C.  Horald Pollen et al. Lenox Ahr. 0981;191(47): 2061-2068  (http://education.[employer].com/faq/FAQ164)    HDL  Date Value Ref Range Status  07/20/2022 76 > OR = 40 mg/dL Final  82/95/6213 83 >08 mg/dL Final   Triglycerides  Date Value Ref Range Status  07/20/2022 48 <150 mg/dL Final         Passed - Patient is not pregnant      Passed - Valid encounter within last 12 months    Recent Outpatient Visits           5 months ago Benign hypertension with CKD (chronic kidney disease) stage III [place] [facility])   [place] [facility] Smitty Cords, DO   11  months ago Annual physical exam   [place] [facility] Smitty Cords, DO   1 year ago Benign hypertension with CKD (chronic kidney disease), stage II   [place] [facility], Netta Neat, DO   2 years ago Annual physical exam   [place] [facility] Smitty Cords, DO   2 years ago Benign hypertension with CKD (chronic kidney disease) stage III [facility])   [place] [facility] [place], Netta Neat, DO       Future Appointments             In 3 weeks Althea Charon, Netta Neat, DO [place] [facility], [facility]

## 2023-07-29 ENCOUNTER — Other Ambulatory Visit: Payer: Self-pay | Admitting: Family Medicine

## 2023-07-29 DIAGNOSIS — I129 Hypertensive chronic kidney disease with stage 1 through stage 4 chronic kidney disease, or unspecified chronic kidney disease: Secondary | ICD-10-CM

## 2023-08-09 ENCOUNTER — Other Ambulatory Visit: Payer: Medicare HMO

## 2023-08-09 DIAGNOSIS — Z0000 Encounter for general adult medical examination without abnormal findings: Secondary | ICD-10-CM | POA: Diagnosis not present

## 2023-08-09 DIAGNOSIS — M150 Primary generalized (osteo)arthritis: Secondary | ICD-10-CM

## 2023-08-09 DIAGNOSIS — R7309 Other abnormal glucose: Secondary | ICD-10-CM

## 2023-08-09 DIAGNOSIS — N138 Other obstructive and reflux uropathy: Secondary | ICD-10-CM

## 2023-08-09 DIAGNOSIS — N401 Enlarged prostate with lower urinary tract symptoms: Secondary | ICD-10-CM | POA: Diagnosis not present

## 2023-08-09 DIAGNOSIS — I129 Hypertensive chronic kidney disease with stage 1 through stage 4 chronic kidney disease, or unspecified chronic kidney disease: Secondary | ICD-10-CM

## 2023-08-09 DIAGNOSIS — E7800 Pure hypercholesterolemia, unspecified: Secondary | ICD-10-CM

## 2023-08-09 DIAGNOSIS — N183 Chronic kidney disease, stage 3 unspecified: Secondary | ICD-10-CM | POA: Diagnosis not present

## 2023-08-10 LAB — CBC WITH DIFFERENTIAL/PLATELET
Absolute Lymphocytes: 1499 {cells}/uL (ref 850–3900)
Absolute Monocytes: 407 {cells}/uL (ref 200–950)
Basophils Absolute: 41 {cells}/uL (ref 0–200)
Basophils Relative: 0.7 %
Eosinophils Absolute: 330 {cells}/uL (ref 15–500)
Eosinophils Relative: 5.6 %
HCT: 41.9 % (ref 38.5–50.0)
Hemoglobin: 13.8 g/dL (ref 13.2–17.1)
MCH: 30.1 pg (ref 27.0–33.0)
MCHC: 32.9 g/dL (ref 32.0–36.0)
MCV: 91.5 fL (ref 80.0–100.0)
MPV: 10.1 fL (ref 7.5–12.5)
Monocytes Relative: 6.9 %
Neutro Abs: 3623 {cells}/uL (ref 1500–7800)
Neutrophils Relative %: 61.4 %
Platelets: 268 10*3/uL (ref 140–400)
RBC: 4.58 10*6/uL (ref 4.20–5.80)
RDW: 12.8 % (ref 11.0–15.0)
Total Lymphocyte: 25.4 %
WBC: 5.9 10*3/uL (ref 3.8–10.8)

## 2023-08-10 LAB — LIPID PANEL
Cholesterol: 194 mg/dL (ref <200)
HDL: 78 mg/dL (ref >=40)
LDL Cholesterol (Calc): 100 mg/dL — ABNORMAL HIGH
Non-HDL Cholesterol (Calc): 116 mg/dL (ref <130)
Total CHOL/HDL Ratio: 2.5 (calc) (ref <5.0)
Triglycerides: 70 mg/dL (ref <150)

## 2023-08-10 LAB — TSH: TSH: 1.7 m[IU]/L (ref 0.40–4.50)

## 2023-08-10 LAB — PSA: PSA: 1.41 ng/mL (ref <=4.00)

## 2023-08-10 LAB — HEMOGLOBIN A1C
Hgb A1c MFr Bld: 5.8 %{Hb} — ABNORMAL HIGH (ref <5.7)
Mean Plasma Glucose: 120 mg/dL
eAG (mmol/L): 6.6 mmol/L

## 2023-08-10 LAB — COMPLETE METABOLIC PANEL WITH GFR
AG Ratio: 1.5 (calc) (ref 1.0–2.5)
ALT: 11 U/L (ref 9–46)
AST: 15 U/L (ref 10–35)
Albumin: 4.1 g/dL (ref 3.6–5.1)
Alkaline phosphatase (APISO): 89 U/L (ref 35–144)
BUN/Creatinine Ratio: 23 (calc) — ABNORMAL HIGH (ref 6–22)
BUN: 27 mg/dL — ABNORMAL HIGH (ref 7–25)
CO2: 27 mmol/L (ref 20–32)
Calcium: 9.4 mg/dL (ref 8.6–10.3)
Chloride: 105 mmol/L (ref 98–110)
Creat: 1.15 mg/dL (ref 0.70–1.28)
Globulin: 2.7 g/dL (ref 1.9–3.7)
Glucose, Bld: 97 mg/dL (ref 65–99)
Potassium: 4.9 mmol/L (ref 3.5–5.3)
Sodium: 138 mmol/L (ref 135–146)
Total Bilirubin: 0.4 mg/dL (ref 0.2–1.2)
Total Protein: 6.8 g/dL (ref 6.1–8.1)
eGFR: 66 mL/min/{1.73_m2} (ref >=60)

## 2023-08-16 ENCOUNTER — Encounter: Payer: Self-pay | Admitting: Family Medicine

## 2023-08-16 ENCOUNTER — Ambulatory Visit (INDEPENDENT_AMBULATORY_CARE_PROVIDER_SITE_OTHER): Payer: Medicare HMO | Admitting: Family Medicine

## 2023-08-16 VITALS — BP 118/64 | HR 67 | Ht 68.0 in | Wt 155.0 lb

## 2023-08-16 DIAGNOSIS — N182 Chronic kidney disease, stage 2 (mild): Secondary | ICD-10-CM

## 2023-08-16 DIAGNOSIS — Z0000 Encounter for general adult medical examination without abnormal findings: Secondary | ICD-10-CM

## 2023-08-16 DIAGNOSIS — N401 Enlarged prostate with lower urinary tract symptoms: Secondary | ICD-10-CM

## 2023-08-16 DIAGNOSIS — Z23 Encounter for immunization: Secondary | ICD-10-CM

## 2023-08-16 DIAGNOSIS — M150 Primary generalized (osteo)arthritis: Secondary | ICD-10-CM | POA: Diagnosis not present

## 2023-08-16 DIAGNOSIS — F5101 Primary insomnia: Secondary | ICD-10-CM

## 2023-08-16 DIAGNOSIS — I129 Hypertensive chronic kidney disease with stage 1 through stage 4 chronic kidney disease, or unspecified chronic kidney disease: Secondary | ICD-10-CM | POA: Diagnosis not present

## 2023-08-16 DIAGNOSIS — E7800 Pure hypercholesterolemia, unspecified: Secondary | ICD-10-CM | POA: Diagnosis not present

## 2023-08-16 DIAGNOSIS — N138 Other obstructive and reflux uropathy: Secondary | ICD-10-CM

## 2023-08-16 DIAGNOSIS — R7309 Other abnormal glucose: Secondary | ICD-10-CM

## 2023-08-16 DIAGNOSIS — N183 Hypertensive chronic kidney disease with stage 1 through stage 4 chronic kidney disease, or unspecified chronic kidney disease: Secondary | ICD-10-CM

## 2023-08-16 MED ORDER — ZOLPIDEM TARTRATE 5 MG PO TABS: 2.5000 mg | ORAL_TABLET | Freq: Every day | ORAL | 1 refills | Status: DC

## 2023-08-16 NOTE — Assessment & Plan Note (Signed)
Controlled HTN Reviewed outside readings Complication with CKD-II improved Cr    Plan:  1. Continue current BP regimen Amlodipine 5mg  daily, Lisinopril 40mg  daily 2. Encourage improved lifestyle - low sodium diet, regular exercise 3. Continue monitor BP outside office, bring readings to next visit, if persistently >140/90 or new symptoms notify office sooner

## 2023-08-16 NOTE — Assessment & Plan Note (Signed)
Chronic BPH Stable on Finasteride Off Flomax Stable PSA

## 2023-08-16 NOTE — Assessment & Plan Note (Signed)
Mild elevated A1c 5.8  Plan:  1. Not on any therapy currently 2. Encourage improved lifestyle - low carb, low sugar diet, reduce portion size, continue improving regular exercise

## 2023-08-16 NOTE — Assessment & Plan Note (Signed)
Mostly controlled cholesterol on statin and lifestyle Last lipid panel 2024  The 10-year ASCVD risk score (Arnett DK, et al., 2019) is: 23.2%   Plan: 1. Continue Lovastatin 20mg  daily now 2. Counseling on ASCVD risk reduction 3. Encourage improved lifestyle - low carb/cholesterol, reduce portion size, continue improving regular exercise

## 2023-08-16 NOTE — Assessment & Plan Note (Addendum)
Stable chronic issue Chronic OA/DJD multiple joints knees, hands, feet, prior problems with shoulders. - Complicated by CKD - Followed by [facility] Dr [name] Prior injection series  Plan: - Continue current plan Meloxicam 7.5mg  most days, seems to be tolerating well, CKD stable to improved - Continue Tramadol 50mg  QID - reviewed PDMP - Continue Diclofenac topical gel 1-2 times daily - Continue inc dose Tylenol

## 2023-08-16 NOTE — Assessment & Plan Note (Signed)
Improved to CKD II  likely secondary to HTN, also history of NSAID use - Cr baseline 1.15  To 1.3  Plan: Limit NSAID meloxicam at lower dose, daily PRN Improve hydration

## 2023-08-16 NOTE — Progress Notes (Signed)
Subjective:    Patient ID: Jerry Simmons, male    DOB: [birth year], 77 y.o.   MRN: [id number]  Jerry Simmons is a 77 y.o. male presenting on 08/16/2023 for Annual Exam   HPI  Here for Annual Physical and Lab Review  Discussed the use of AI scribe software for clinical note transcription with the patient, who gave verbal consent to proceed.   Elevated A1c A1c up to 5.8 - Not taking medicine - Lifestyle - Diet: Goal to improve limit sweets - Exercise: active, walking  HYPERLIPIDEMIA: - Reports no concerns. Last lipid panel 07/2023, controlled  LDL 100 - Currently taking Lovastatin 20mg , tolerating well without side effects or myalgias   Osteoarthritis, Multiple joints (knees, hands, feet) / Chronic Knee Pain Chronic problem with arthritis Followed by [facility] Dr [name] Failed steroid injections Completed PT and Synvisc injections On controlled subs agreement opoid for Tramadol, doing well, taking QID rarely takes a half or skip a dose. - Taking Meloxicam 7.5 AS NEEDED on most days, and topical Diclofenac and Tylenol -Also taking Glucosamine 1500mg  and Chondroitin 1200mg  twice daily Denies redness swelling worsening pain, numbness tingling weakness other joint problem.   CHRONIC HTN CKD II Reports no new concerns Lab Cr 1.15 improved Current Meds - Amlodipine 5mg  daily, Lisinopril 40mg  daily Reports good compliance, took meds today. Tolerating well, w/o complaints. Denies CP, dyspnea, HA, edema, dizziness / lightheadedness   Insomnia, primary Improved on Ambien half dose of 5mg  now 2.5mg  dose nightly AS NEEDED Needs re order  Health Maintenance: Flu Shot today  Consider COVID Booster at pharmacy  RSV done 2023. No repeat required  Cologuard 08/10/22 negative.  PSA 1.141 07/2023      08/16/2023    3:08 PM 02/15/2023    3:38 PM 08/03/2022    9:33 AM  Depression screen PHQ 2/9  Decreased Interest 0 0 0  Down, Depressed, Hopeless 0 0 0  PHQ - 2 Score  0 0 0  Altered sleeping 0  0  Tired, decreased energy 0  0  Change in appetite 0  0  Feeling bad or failure about yourself  0  0  Trouble concentrating 0  0  Moving slowly or fidgety/restless 0  0  Suicidal thoughts 0  0  PHQ-9 Score 0  0  Difficult doing work/chores Not difficult at all  Not difficult at all    Past Medical History:  Diagnosis Date   Arthritis    Past Surgical History:  Procedure Laterality Date   EYE SURGERY  12/10/2018   Right eye   Social History   Socioeconomic History   Marital status: Married    Spouse name: Not on file   Number of children: Not on file   Years of education: Not on file   Highest education level: Not on file  Occupational History   Not on file  Tobacco Use   Smoking status: Former    Current packs/day: 0.00    Average packs/day: 3.0 packs/day for 20.0 years (60.0 ttl pk-yrs)    Types: Cigarettes    Start date: 09/16/1965    Quit date: 09/16/1985    Years since quitting: 37.9   Smokeless tobacco: Former  Substance and Sexual Activity   Alcohol use: Yes    Alcohol/week: 1.0 standard drink of alcohol    Types: 1 Glasses of wine per week    Comment: Only has wine on occassional.    Drug use: No   Sexual activity: Not on  file  Other Topics Concern   Not on file  Social History Narrative   Not on file   Social Determinants of Health   Financial Resource Strain: Not on file  Food Insecurity: Not on file  Transportation Needs: Not on file  Physical Activity: Not on file  Stress: Not on file  Social Connections: Not on file  Intimate Partner Violence: Not on file   History reviewed. No pertinent family history. Current Outpatient Medications on File Prior to Visit  Medication Sig   amLODipine (NORVASC) 5 MG tablet TAKE 1 TABLET EVERY DAY   finasteride (PROSCAR) 5 MG tablet TAKE 1 TABLET EVERY DAY   lisinopril (ZESTRIL) 40 MG tablet TAKE 1 TABLET EVERY DAY (NEED LABS FOR REFILLS)   lovastatin (MEVACOR) 20 MG tablet TAKE  1 TABLET EVERY DAY   meloxicam (MOBIC) 7.5 MG tablet TAKE 1 TABLET EVERY DAY AS NEEDED FOR PAIN   traMADol (ULTRAM) 50 MG tablet Take 1 tablet (50 mg total) by mouth 4 (four) times daily as needed for moderate pain or severe pain.   No current facility-administered medications on file prior to visit.    Review of Systems  Constitutional:  Negative for activity change, appetite change, chills, diaphoresis, fatigue and fever.  HENT:  Negative for congestion and hearing loss.   Eyes:  Negative for visual disturbance.  Respiratory:  Negative for cough, chest tightness, shortness of breath and wheezing.   Cardiovascular:  Negative for chest pain, palpitations and leg swelling.  Gastrointestinal:  Negative for abdominal pain, constipation, diarrhea, nausea and vomiting.  Genitourinary:  Negative for dysuria, frequency and hematuria.  Musculoskeletal:  Negative for arthralgias and neck pain.  Skin:  Negative for rash.  Neurological:  Negative for dizziness, weakness, light-headedness, numbness and headaches.  Hematological:  Negative for adenopathy.  Psychiatric/Behavioral:  Negative for behavioral problems, dysphoric mood and sleep disturbance.    Per HPI unless specifically indicated above      Objective:    BP 118/64   Pulse 67   Ht 5\' 8"  (1.727 m)   Wt 155 lb (70.3 kg)   SpO2 97%   BMI 23.57 kg/m   Wt Readings from Last 3 Encounters:  08/16/23 155 lb (70.3 kg)  02/15/23 155 lb (70.3 kg)  08/03/22 160 lb 3.2 oz (72.7 kg)    Physical Exam Vitals and nursing note reviewed.  Constitutional:      General: He is not in acute distress.    Appearance: He is well-developed. He is not diaphoretic.     Comments: Well-appearing, comfortable, cooperative  HENT:     Head: Normocephalic and atraumatic.  Eyes:     General:        Right eye: No discharge.        Left eye: No discharge.     Conjunctiva/sclera: Conjunctivae normal.     Pupils: Pupils are equal, round, and reactive to  light.  Neck:     Thyroid: No thyromegaly.     Vascular: No carotid bruit.  Cardiovascular:     Rate and Rhythm: Normal rate and regular rhythm.     Pulses: Normal pulses.     Heart sounds: Normal heart sounds. No murmur heard. Pulmonary:     Effort: Pulmonary effort is normal. No respiratory distress.     Breath sounds: Normal breath sounds. No wheezing or rales.  Abdominal:     General: Bowel sounds are normal. There is no distension.     Palpations: Abdomen is soft. There  is no mass.     Tenderness: There is no abdominal tenderness.  Musculoskeletal:        General: No tenderness. Normal range of motion.     Cervical back: Normal range of motion and neck supple.     Right lower leg: No edema.     Left lower leg: No edema.     Comments: Upper / Lower Extremities: - Normal muscle tone, strength bilateral upper extremities 5/5, lower extremities 5/5  Lymphadenopathy:     Cervical: No cervical adenopathy.  Skin:    General: Skin is warm and dry.     Findings: No erythema or rash.  Neurological:     Mental Status: He is alert and oriented to person, place, and time.     Comments: Distal sensation intact to light touch all extremities  Psychiatric:        Mood and Affect: Mood normal.        Behavior: Behavior normal.        Thought Content: Thought content normal.     Comments: Well groomed, good eye contact, normal speech and thoughts      Results for orders placed or performed in visit on 08/09/23  TSH  Result Value Ref Range   TSH 1.70 0.40 - 4.50 mIU/L  PSA  Result Value Ref Range   PSA 1.41 < OR = 4.00 ng/mL  Lipid panel  Result Value Ref Range   Cholesterol 194 <200 mg/dL   HDL 78 > OR = 40 mg/dL   Triglycerides 70 <440 mg/dL   LDL Cholesterol (Calc) 100 (H) mg/dL (calc)   Total CHOL/HDL Ratio 2.5 <5.0 (calc)   Non-HDL Cholesterol (Calc) 116 <130 mg/dL (calc)  Hemoglobin N0U  Result Value Ref Range   Hgb A1c MFr Bld 5.8 (H) <5.7 % of total Hgb   Mean  Plasma Glucose 120 mg/dL   eAG (mmol/L) 6.6 mmol/L  CBC with Differential/Platelet  Result Value Ref Range   WBC 5.9 3.8 - 10.8 Thousand/uL   RBC 4.58 4.20 - 5.80 Million/uL   Hemoglobin 13.8 13.2 - 17.1 g/dL   HCT 72.5 36.6 - 44.0 %   MCV 91.5 80.0 - 100.0 fL   MCH 30.1 27.0 - 33.0 pg   MCHC 32.9 32.0 - 36.0 g/dL   RDW 34.7 42.5 - 95.6 %   Platelets 268 140 - 400 Thousand/uL   MPV 10.1 7.5 - 12.5 fL   Neutro Abs 3,623 1,500 - 7,800 cells/uL   Absolute Lymphocytes 1,499 850 - 3,900 cells/uL   Absolute Monocytes 407 200 - 950 cells/uL   Eosinophils Absolute 330 15 - 500 cells/uL   Basophils Absolute 41 0 - 200 cells/uL   Neutrophils Relative % 61.4 %   Total Lymphocyte 25.4 %   Monocytes Relative 6.9 %   Eosinophils Relative 5.6 %   Basophils Relative 0.7 %  COMPLETE METABOLIC PANEL WITH GFR  Result Value Ref Range   Glucose, Bld 97 65 - 99 mg/dL   BUN 27 (H) 7 - 25 mg/dL   Creat 3.87 5.64 - 3.32 mg/dL   eGFR 66 > OR = 60 RJ/JOA/4.16S0   BUN/Creatinine Ratio 23 (H) 6 - 22 (calc)   Sodium 138 135 - 146 mmol/L   Potassium 4.9 3.5 - 5.3 mmol/L   Chloride 105 98 - 110 mmol/L   CO2 27 20 - 32 mmol/L   Calcium 9.4 8.6 - 10.3 mg/dL   Total Protein 6.8 6.1 - 8.1 g/dL  Albumin 4.1 3.6 - 5.1 g/dL   Globulin 2.7 1.9 - 3.7 g/dL (calc)   AG Ratio 1.5 1.0 - 2.5 (calc)   Total Bilirubin 0.4 0.2 - 1.2 mg/dL   Alkaline phosphatase (APISO) 89 35 - 144 U/L   AST 15 10 - 35 U/L   ALT 11 9 - 46 U/L      Assessment & Plan:   Problem List Items Addressed This Visit     Benign hypertension with CKD (chronic kidney disease) stage III (HCC)    Controlled HTN Reviewed outside readings Complication with CKD-II improved Cr    Plan:  1. Continue current BP regimen Amlodipine 5mg  daily, Lisinopril 40mg  daily 2. Encourage improved lifestyle - low sodium diet, regular exercise 3. Continue monitor BP outside office, bring readings to next visit, if persistently >140/90 or new symptoms notify  office sooner      BPH with obstruction/lower urinary tract symptoms    Chronic BPH Stable on Finasteride Off Flomax Stable PSA      CKD (chronic kidney disease), stage II    Improved to CKD II  likely secondary to HTN, also history of NSAID use - Cr baseline 1.15  To 1.3  Plan: Limit NSAID meloxicam at lower dose, daily PRN Improve hydration      Elevated hemoglobin A1c    Mild elevated A1c 5.8  Plan:  1. Not on any therapy currently 2. Encourage improved lifestyle - low carb, low sugar diet, reduce portion size, continue improving regular exercise      Hypercholesteremia    Mostly controlled cholesterol on statin and lifestyle Last lipid panel 2024  The 10-year ASCVD risk score (Arnett DK, et al., 2019) is: 23.2%   Plan: 1. Continue Lovastatin 20mg  daily now 2. Counseling on ASCVD risk reduction 3. Encourage improved lifestyle - low carb/cholesterol, reduce portion size, continue improving regular exercise      Osteoarthritis of multiple joints    Stable chronic issue Chronic OA/DJD multiple joints knees, hands, feet, prior problems with shoulders. - Complicated by CKD - Followed by [facility] Dr [name] Prior injection series  Plan: - Continue current plan Meloxicam 7.5mg  most days, seems to be tolerating well, CKD stable to improved - Continue Tramadol 50mg  QID - reviewed PDMP - Continue Diclofenac topical gel 1-2 times daily - Continue inc dose Tylenol      Primary insomnia    Insomnia, episodic Usually primary etiology Continue on Ambien 2.5mg  nightly AS NEEDED, half dose of 5mg  has refills      Relevant Medications   zolpidem (AMBIEN) 5 MG tablet   Other Visit Diagnoses     Annual physical exam    -  Primary   Need for influenza vaccination       Relevant Orders   Flu Vaccine Trivalent High Dose (Fluad) (Completed)       Updated Health Maintenance information Reviewed recent lab results with patient Encouraged improvement to  lifestyle with diet and exercise Goal of weight loss  General Health Maintenance -Consider optional CT scan for calcium scoring for heart disease screening.         Meds ordered this encounter  Medications   zolpidem (AMBIEN) 5 MG tablet    Sig: Take 0.5 tablets (2.5 mg total) by mouth at bedtime.    Dispense:  45 tablet    Refill:  1    Keep on file for future     Follow up plan: Return in about 6 months (around 02/13/2024) for  6 month PreDM A1c, arthritis, sleep updates etc (Friday late pm).  Next visit PreDM POC A1c   Saralyn Pilar, DO [facility] 08/16/2023, 3:16 PM

## 2023-08-16 NOTE — Patient Instructions (Addendum)
Thank you for coming to the office today.  Flu Shot today  Next COVID Booster when ready  No more RSV needed, you did this last year 2023.  Refilled Ambien sleeping med. Keep on current dose, seems to work well.  Other meds have refills already.  Recent Labs    08/09/23 0753  HGBA1C 5.8*   Goal to limit some excess carb starch sugars in the diet. I am noto worried about   Finger stick blood test next time.  Improved kidney function.  Keep on current pain medications, no change required.  Consider Calcium Score CT Scan of Chest if you are interested to investigate the arteries of the heart. $99 if you want me to order it.   Please schedule a Follow-up Appointment to: Return in about 6 months (around 02/13/2024) for 6 month PreDM A1c, arthritis, sleep updates etc (Friday late pm).  If you have any other questions or concerns, please feel free to call the office or send a message through MyChart. You may also schedule an earlier appointment if necessary.  Additionally, you may be receiving a survey about your experience at our office within a few days to 1 week by e-mail or mail. We value your feedback.  Saralyn Pilar, DO [facility], [place]

## 2023-08-16 NOTE — Assessment & Plan Note (Signed)
Insomnia, episodic Usually primary etiology Continue on Ambien 2.5mg  nightly AS NEEDED, half dose of 5mg  has refills

## 2023-11-06 ENCOUNTER — Other Ambulatory Visit: Payer: Self-pay | Admitting: Family Medicine

## 2023-11-06 DIAGNOSIS — M150 Primary generalized (osteo)arthritis: Secondary | ICD-10-CM

## 2023-11-06 DIAGNOSIS — N138 Other obstructive and reflux uropathy: Secondary | ICD-10-CM

## 2023-11-06 DIAGNOSIS — G8929 Other chronic pain: Secondary | ICD-10-CM

## 2023-11-06 NOTE — Telephone Encounter (Signed)
Requested medication (s) are due for refill today: review   Requested medication (s) are on the active medication list: yes   Last refill:  06/04/23 #120/5   Future visit scheduled: yes   Notes to clinic:  Unable to refill per protocol, cannot delegate.    Requested Prescriptions  Pending Prescriptions Disp Refills   traMADol (ULTRAM) 50 MG tablet [Pharmacy Med Name: traMADol HCl Oral Tablet 50 MG] 120 tablet      Not Delegated - Analgesics:  Opioid Agonists Failed - 11/06/2023  4:05 PM      Failed - This refill cannot be delegated      Failed - Urine Drug Screen completed in last 360 days      Passed - Valid encounter within last 3 months    Recent Outpatient Visits           2 months ago Annual physical exam   [place] [facility] [place], Netta Neat, DO   8 months ago Benign hypertension with CKD (chronic kidney disease) stage III [facility])   [place] [facility] Althea Charon, Netta Neat, DO   1 year ago Annual physical exam   [place] [facility] Smitty Cords, DO   1 year ago Benign hypertension with CKD (chronic kidney disease), stage II   [place] [facility] [place], Netta Neat, DO   2 years ago Annual physical exam   [place] [facility] Althea Charon, Netta Neat, DO       Future Appointments             In 3 months Althea Charon, Netta Neat, DO [place] [facility]            Refused Prescriptions Disp Refills   finasteride (PROSCAR) 5 MG tablet [Pharmacy Med Name: Finasteride Oral Tablet 5 MG] 90 tablet 3    Sig: TAKE 1 TABLET EVERY DAY     Urology: 5-alpha Reductase Inhibitors Passed - 11/06/2023  4:05 PM      Passed - PSA in normal range and within 360 days    PSA  Date Value Ref Range Status  08/09/2023 1.41 < OR = 4.00 ng/mL Final    Comment:    The total PSA value from this assay system is  standardized against  the WHO standard. The test  result will be approximately 20% lower when compared  to the equimolar-standardized total PSA (Beckman  Coulter). Comparison of serial PSA results should be  interpreted with this fact in mind. . This test was performed using the Siemens  chemiluminescent method. Values obtained from  different assay methods cannot be used interchangeably. PSA levels, regardless of value, should not be interpreted as absolute evidence of the presence or absence of disease.   12/14/2014 2.4  Final   Prostate Specific Ag, Serum  Date Value Ref Range Status  10/26/2015 2.5 0.0 - 4.0 ng/mL Final    Comment:    Roche ECLIA methodology. According to the American Urological Association, Serum PSA should decrease and remain at undetectable levels after radical prostatectomy. The AUA defines biochemical recurrence as an initial PSA value 0.2 ng/mL or greater followed by a subsequent confirmatory PSA value 0.2 ng/mL or greater. Values obtained with different assay methods or kits cannot be used interchangeably. Results cannot be interpreted as absolute evidence of the presence or absence of malignant disease.          Passed - Valid  encounter within last 12 months    Recent Outpatient Visits           2 months ago Annual physical exam   [place] [facility] [place], Netta Neat, DO   8 months ago Benign hypertension with CKD (chronic kidney disease) stage III [facility])   [place] Smitty Cords, DO   1 year ago Annual physical exam   [place] [facility] Smitty Cords, DO   1 year ago Benign hypertension with CKD (chronic kidney disease), stage II   [place] [facility] Smitty Cords, DO   2 years ago Annual physical exam   [place] [facility] Smitty Cords, DO       Future Appointments             In 3 months  Althea Charon, Netta Neat, DO [place] [facility], [facility]

## 2023-11-06 NOTE — Telephone Encounter (Signed)
Requested medication (s) are due for refill today: review  Requested medication (s) are on the active medication list: yes  Last refill:  06/04/23 #120/5  Future visit scheduled: yes  Notes to clinic:  Unable to refill per protocol, cannot delegate.    Requested Prescriptions  Pending Prescriptions Disp Refills   traMADol (ULTRAM) 50 MG tablet [Pharmacy Med Name: traMADol HCl Oral Tablet 50 MG] 120 tablet      Not Delegated - Analgesics:  Opioid Agonists Failed - 11/06/2023  1:17 PM      Failed - This refill cannot be delegated      Failed - Urine Drug Screen completed in last 360 days      Passed - Valid encounter within last 3 months    Recent Outpatient Visits           2 months ago Annual physical exam   [place] [place], Netta Neat, DO   8 months ago Benign hypertension with CKD (chronic kidney disease) stage III [facility])   [place] [facility] Althea Charon, Netta Neat, DO   1 year ago Annual physical exam   [place] [facility] Smitty Cords, DO   1 year ago Benign hypertension with CKD (chronic kidney disease), stage II   [place] [facility] [place], Netta Neat, DO   2 years ago Annual physical exam   [place] [facility] Althea Charon, Netta Neat, DO       Future Appointments             In 3 months Althea Charon, Netta Neat, DO  [facility]            Refused Prescriptions Disp Refills   finasteride (PROSCAR) 5 MG tablet [Pharmacy Med Name: Finasteride Oral Tablet 5 MG] 90 tablet 3    Sig: TAKE 1 TABLET EVERY DAY     Urology: 5-alpha Reductase Inhibitors Passed - 11/06/2023  1:17 PM      Passed - PSA in normal range and within 360 days    PSA  Date Value Ref Range Status  08/09/2023 1.41 < OR = 4.00 ng/mL Final    Comment:    The total PSA value from this assay system is  standardized against the  WHO standard. The test  result will be approximately 20% lower when compared  to the equimolar-standardized total PSA (Beckman  Coulter). Comparison of serial PSA results should be  interpreted with this fact in mind. . This test was performed using the Siemens  chemiluminescent method. Values obtained from  different assay methods cannot be used interchangeably. PSA levels, regardless of value, should not be interpreted as absolute evidence of the presence or absence of disease.   12/14/2014 2.4  Final   Prostate Specific Ag, Serum  Date Value Ref Range Status  10/26/2015 2.5 0.0 - 4.0 ng/mL Final    Comment:    Roche ECLIA methodology. According to the American Urological Association, Serum PSA should decrease and remain at undetectable levels after radical prostatectomy. The AUA defines biochemical recurrence as an initial PSA value 0.2 ng/mL or greater followed by a subsequent confirmatory PSA value 0.2 ng/mL or greater. Values obtained with different assay methods or kits cannot be used interchangeably. Results cannot be interpreted as absolute evidence of the presence or absence of malignant disease.          Passed - Valid encounter within last 12  months    Recent Outpatient Visits           2 months ago Annual physical exam   [place] [facility], Netta Neat, DO   8 months ago Benign hypertension with CKD (chronic kidney disease) stage III [facility])   [place] [facility] Smitty Cords, DO   1 year ago Annual physical exam   [place] [facility] Smitty Cords, DO   1 year ago Benign hypertension with CKD (chronic kidney disease), stage II   [place] [facility] Smitty Cords, DO   2 years ago Annual physical exam   [place] [facility] Smitty Cords, DO       Future Appointments             In 3 months  Althea Charon, Netta Neat, DO  [facility], [facility]

## 2023-11-06 NOTE — Telephone Encounter (Signed)
Rx was sent to pharmacy on 01/10/23 #90/3.   Requested Prescriptions  Pending Prescriptions Disp Refills   traMADol (ULTRAM) 50 MG tablet [Pharmacy Med Name: traMADol HCl Oral Tablet 50 MG] 120 tablet      Not Delegated - Analgesics:  Opioid Agonists Failed - 11/06/2023  1:09 PM      Failed - This refill cannot be delegated      Failed - Urine Drug Screen completed in last 360 days      Passed - Valid encounter within last 3 months    Recent Outpatient Visits           2 months ago Annual physical exam   [place] [facility] [place], Netta Neat, DO   8 months ago Benign hypertension with CKD (chronic kidney disease) stage III [facility])   [place] [facility] Althea Charon, Netta Neat, DO   1 year ago Annual physical exam   [place] [facility] Smitty Cords, DO   1 year ago Benign hypertension with CKD (chronic kidney disease), stage II   [place] [facility] [place], Netta Neat, DO   2 years ago Annual physical exam   [place] [facility] Althea Charon, Netta Neat, DO       Future Appointments             In 3 months Althea Charon, Netta Neat, DO [place] [facility]            Refused Prescriptions Disp Refills   finasteride (PROSCAR) 5 MG tablet [Pharmacy Med Name: Finasteride Oral Tablet 5 MG] 90 tablet 3    Sig: TAKE 1 TABLET EVERY DAY     Urology: 5-alpha Reductase Inhibitors Passed - 11/06/2023  1:09 PM      Passed - PSA in normal range and within 360 days    PSA  Date Value Ref Range Status  08/09/2023 1.41 < OR = 4.00 ng/mL Final    Comment:    The total PSA value from this assay system is  standardized against the WHO standard. The test  result will be approximately 20% lower when compared  to the equimolar-standardized total PSA (Beckman  Coulter). Comparison of serial PSA results should be  interpreted with this fact in  mind. . This test was performed using the Siemens  chemiluminescent method. Values obtained from  different assay methods cannot be used interchangeably. PSA levels, regardless of value, should not be interpreted as absolute evidence of the presence or absence of disease.   12/14/2014 2.4  Final   Prostate Specific Ag, Serum  Date Value Ref Range Status  10/26/2015 2.5 0.0 - 4.0 ng/mL Final    Comment:    Roche ECLIA methodology. According to the American Urological Association, Serum PSA should decrease and remain at undetectable levels after radical prostatectomy. The AUA defines biochemical recurrence as an initial PSA value 0.2 ng/mL or greater followed by a subsequent confirmatory PSA value 0.2 ng/mL or greater. Values obtained with different assay methods or kits cannot be used interchangeably. Results cannot be interpreted as absolute evidence of the presence or absence of malignant disease.          Passed - Valid encounter within last 12 months    Recent Outpatient Visits           2 months ago Annual physical exam   [place] [facility] [place], Netta Neat, [place]  8 months ago Benign hypertension with CKD (chronic kidney disease) stage III [facility])   [place] [facility] Althea Charon, Netta Neat, DO   1 year ago Annual physical exam   [place] [facility] Smitty Cords, DO   1 year ago Benign hypertension with CKD (chronic kidney disease), stage II   [place] [facility] Smitty Cords, DO   2 years ago Annual physical exam   [place] [facility] Smitty Cords, DO       Future Appointments             In 3 months Althea Charon, Netta Neat, DO [place] [facility], [facility]

## 2024-02-14 ENCOUNTER — Other Ambulatory Visit: Payer: Self-pay | Admitting: Family Medicine

## 2024-02-14 ENCOUNTER — Ambulatory Visit (INDEPENDENT_AMBULATORY_CARE_PROVIDER_SITE_OTHER): Admitting: Family Medicine

## 2024-02-14 ENCOUNTER — Encounter: Payer: Self-pay | Admitting: Family Medicine

## 2024-02-14 VITALS — BP 122/64 | HR 62 | Ht 68.0 in | Wt 156.0 lb

## 2024-02-14 DIAGNOSIS — N401 Enlarged prostate with lower urinary tract symptoms: Secondary | ICD-10-CM

## 2024-02-14 DIAGNOSIS — Z23 Encounter for immunization: Secondary | ICD-10-CM | POA: Diagnosis not present

## 2024-02-14 DIAGNOSIS — E7800 Pure hypercholesterolemia, unspecified: Secondary | ICD-10-CM

## 2024-02-14 DIAGNOSIS — I129 Hypertensive chronic kidney disease with stage 1 through stage 4 chronic kidney disease, or unspecified chronic kidney disease: Secondary | ICD-10-CM

## 2024-02-14 DIAGNOSIS — N183 Chronic kidney disease, stage 3 unspecified: Secondary | ICD-10-CM | POA: Diagnosis not present

## 2024-02-14 DIAGNOSIS — G8929 Other chronic pain: Secondary | ICD-10-CM

## 2024-02-14 DIAGNOSIS — N138 Other obstructive and reflux uropathy: Secondary | ICD-10-CM

## 2024-02-14 DIAGNOSIS — M150 Primary generalized (osteo)arthritis: Secondary | ICD-10-CM

## 2024-02-14 DIAGNOSIS — R7309 Other abnormal glucose: Secondary | ICD-10-CM

## 2024-02-14 DIAGNOSIS — F5101 Primary insomnia: Secondary | ICD-10-CM | POA: Diagnosis not present

## 2024-02-14 DIAGNOSIS — Z0000 Encounter for general adult medical examination without abnormal findings: Secondary | ICD-10-CM

## 2024-02-14 LAB — POCT GLYCOSYLATED HEMOGLOBIN (HGB A1C): Hemoglobin A1C: 5.2 % (ref 4.0–5.6)

## 2024-02-14 MED ORDER — LISINOPRIL 40 MG PO TABS: 40.0000 mg | ORAL_TABLET | Freq: Every day | ORAL | 3 refills | Status: AC

## 2024-02-14 MED ORDER — FINASTERIDE 5 MG PO TABS: 5.0000 mg | ORAL_TABLET | Freq: Every day | ORAL | 3 refills | Status: AC

## 2024-02-14 NOTE — Progress Notes (Signed)
 Subjective:    Patient ID: Jerry Simmons, male    DOB: January 15, 1946, 78 y.o.   MRN: [id number]  Jerry Simmons is a 78 y.o. male presenting on 02/14/2024 for Hypertension and Osteoarthritis   HPI  Discussed the use of AI scribe software for clinical note transcription with the patient, who gave verbal consent to proceed.  History of Present Illness   Jerry Gaye "Forestine Igo" is a 78 year old male who presents for a follow-up visit.   Elevated A1c  His blood sugar levels have normalized, with an A1c of 5.2, improved from a previous 5.8. He has not made significant dietary changes but may have inadvertently reduced sugar intake.    HYPERLIPIDEMIA: - Reports no concerns. Last lipid panel 07/2023, controlled  LDL 100 - Currently taking Lovastatin  20mg , tolerating well without side effects or myalgias   Osteoarthritis, Multiple joints (knees, hands, feet) / Chronic Knee Pain Chronic problem with arthritis Followed by [facility] Dr Alan All Failed steroid injections Completed PT and Synvisc injections On controlled subs agreement opoid for Tramadol , doing well, taking QID rarely takes a half or skip a dose. - Taking Meloxicam  7.5 AS NEEDED on most days, and topical Diclofenac  and Tylenol  -Also taking Glucosamine 1500mg  and Chondroitin 1200mg  twice daily Denies redness swelling worsening pain, numbness tingling weakness other joint problem.   CHRONIC HTN CKD II Reports no new concerns Current Meds - Amlodipine  5mg  daily, Lisinopril  40mg  daily Reports good compliance, took meds today. Tolerating well, w/o complaints. Denies CP, dyspnea, HA, edema, dizziness / lightheadedness   Insomnia, primary Improved on Ambien  half dose of 5mg  now 2.5mg  dose nightly AS NEEDED  Health Maintenance: Prevnar 20 today     02/14/2024    3:49 PM 08/16/2023    3:08 PM 02/15/2023    3:38 PM  Depression screen PHQ 2/9  Decreased Interest 0 0 0  Down, Depressed, Hopeless 0 0 0  PHQ - 2 Score 0 0 0   Altered sleeping  0   Tired, decreased energy  0   Change in appetite  0   Feeling bad or failure about yourself   0   Trouble concentrating  0   Moving slowly or fidgety/restless  0   Suicidal thoughts  0   PHQ-9 Score  0   Difficult doing work/chores  Not difficult at all        02/14/2024    3:50 PM 08/16/2023    3:08 PM 02/15/2023    3:38 PM 08/03/2022    9:33 AM  GAD 7 : Generalized Anxiety Score  Nervous, Anxious, on Edge 0 0 0 0  Control/stop worrying 0 0 0 0  Worry too much - different things 0 0 0 0  Trouble relaxing 0 0 0 0  Restless 0 0 0 0  Easily annoyed or irritable 0 0 0 0  Afraid - awful might happen 0 0 0 0  Total GAD 7 Score 0 0 0 0  Anxiety Difficulty Not difficult at all   Not difficult at all    Social History   Tobacco Use   Smoking status: Former    Current packs/day: 0.00    Average packs/day: 3.0 packs/day for 20.0 years (60.0 ttl pk-yrs)    Types: Cigarettes    Start date: 09/16/1965    Quit date: 09/16/1985    Years since quitting: 38.4   Smokeless tobacco: Former  Substance Use Topics   Alcohol use: Yes    Alcohol/week: 1.0 standard drink  of alcohol    Types: 1 Glasses of wine per week    Comment: Only has wine on occassional.    Drug use: No    Review of Systems Per HPI unless specifically indicated above     Objective:    BP 122/64 (BP Location: Left Arm, Patient Position: Sitting, Cuff Size: Normal)   Pulse 62   Ht 5\' 8"  (1.727 m)   Wt 156 lb (70.8 kg)   SpO2 95%   BMI 23.72 kg/m   Wt Readings from Last 3 Encounters:  02/14/24 156 lb (70.8 kg)  08/16/23 155 lb (70.3 kg)  02/15/23 155 lb (70.3 kg)    Physical Exam Vitals and nursing note reviewed.  Constitutional:      General: He is not in acute distress.    Appearance: Normal appearance. He is well-developed. He is not diaphoretic.     Comments: Well-appearing, comfortable, cooperative  HENT:     Head: Normocephalic and atraumatic.  Eyes:     General:        Right  eye: No discharge.        Left eye: No discharge.     Conjunctiva/sclera: Conjunctivae normal.  Cardiovascular:     Rate and Rhythm: Normal rate.  Pulmonary:     Effort: Pulmonary effort is normal.  Skin:    General: Skin is warm and dry.     Findings: No erythema or rash.  Neurological:     Mental Status: He is alert and oriented to person, place, and time.  Psychiatric:        Mood and Affect: Mood normal.        Behavior: Behavior normal.        Thought Content: Thought content normal.     Comments: Well groomed, good eye contact, normal speech and thoughts     Results for orders placed or performed in visit on 02/14/24  POCT HgB A1C   Collection Time: 02/14/24  3:59 PM  Result Value Ref Range   Hemoglobin A1C 5.2 4.0 - 5.6 %   HbA1c POC (<> result, manual entry)     HbA1c, POC (prediabetic range)     HbA1c, POC (controlled diabetic range)        Assessment & Plan:   Problem List Items Addressed This Visit     Benign hypertension with CKD (chronic kidney disease) stage III (HCC)   Relevant Medications   lisinopril  (ZESTRIL ) 40 MG tablet   BPH with obstruction/lower urinary tract symptoms   Relevant Medications   finasteride  (PROSCAR ) 5 MG tablet   Chronic pain   Elevated hemoglobin A1c - Primary   Relevant Orders   POCT HgB A1C (Completed)   Osteoarthritis of multiple joints   Primary insomnia   Other Visit Diagnoses       Need for Streptococcus pneumoniae vaccination       Relevant Orders   Pneumococcal conjugate vaccine 20-valent (Completed)       - Administer Prevnar 20 vaccine. - Schedule follow-up in six months. - Continue current medications. - Consider heart scan for prevention.  Elevated A1c - improved A1c at 5.2% indicates good glycemic control.  No longer in Pre-Diabetic range.  BPH Controlled on Finasteride  5mg  daily, refill  Hypertension Blood pressure controlled at 122/64 mmHg. - Continue current antihypertensive regimen. Amlodipine   5mg , Lisinopril  40mg   Osteoarthritis multiple joints Chronic condition managed with current regimen. Continue Meloxicam  7.5mg  daily AS NEEDED Continue Tramadol  AS NEEDED for breakthrough pain, not due for  refills  Insomnia Controlled on Zolpidem  IR 2.5 mg (half of 5mg )       Orders Placed This Encounter  Procedures   Pneumococcal conjugate vaccine 20-valent   POCT HgB A1C    Meds ordered this encounter  Medications   finasteride  (PROSCAR ) 5 MG tablet    Sig: Take 1 tablet (5 mg total) by mouth daily.    Dispense:  90 tablet    Refill:  3   lisinopril  (ZESTRIL ) 40 MG tablet    Sig: Take 1 tablet (40 mg total) by mouth daily.    Dispense:  90 tablet    Refill:  3    Follow up plan: Return for 6 month fasting lab > 1 week later Annual Physical.  Future labs ordered for 08/21/24   Domingo Friend, DO [facility] 02/14/2024, 4:06 PM

## 2024-02-14 NOTE — Patient Instructions (Addendum)
 Thank you for coming to the office today.  Recent Labs    08/09/23 0753 02/14/24 1559  HGBA1C 5.8* 5.2   Excellent sugar, no concerns on this. Keep up the great work  BP is well controlled  Continue on the half dose of the sleeping pill  We can continue your current medications, just request.  DUE for FASTING BLOOD WORK (no food or drink after midnight before the lab appointment, only water or coffee without cream/sugar on the morning of)  SCHEDULE "Lab Only" visit in the morning at the clinic for lab draw in 6 MONTHS   - Make sure Lab Only appointment is at about 1 week before your next appointment, so that results will be available  For Lab Results, once available within 2-3 days of blood draw, you can can log in to MyChart online to view your results and a brief explanation. Also, we can discuss results at next follow-up visit.   Please schedule a Follow-up Appointment to: Return for 6 month fasting lab > 1 week later Annual Physical.  If you have any other questions or concerns, please feel free to call the office or send a message through MyChart. You may also schedule an earlier appointment if necessary.  Additionally, you may be receiving a survey about your experience at our office within a few days to 1 week by e-mail or mail. We value your feedback.  Domingo Friend, DO [facility], [place]

## 2024-02-21 ENCOUNTER — Ambulatory Visit: Payer: Self-pay | Admitting: Family Medicine

## 2024-03-14 ENCOUNTER — Encounter: Payer: Self-pay | Admitting: Family Medicine

## 2024-03-14 DIAGNOSIS — F5101 Primary insomnia: Secondary | ICD-10-CM

## 2024-03-16 MED ORDER — ZOLPIDEM TARTRATE 5 MG PO TABS
2.5000 mg | ORAL_TABLET | Freq: Every day | ORAL | 1 refills | Status: AC
Start: 2024-03-16 — End: ?

## 2024-03-29 ENCOUNTER — Other Ambulatory Visit: Payer: Self-pay | Admitting: Family Medicine

## 2024-03-29 DIAGNOSIS — F5101 Primary insomnia: Secondary | ICD-10-CM

## 2024-03-31 NOTE — Telephone Encounter (Signed)
 Requested medications are due for refill today.  no  Requested medications are on the active medications list.  yes  Last refill. 03/16/2024 #45 1 rf  Future visit scheduled.   yes  Notes to clinic.  Refill/ refusal not delegated.    Requested Prescriptions  Pending Prescriptions Disp Refills   zolpidem  (AMBIEN ) 5 MG tablet [Pharmacy Med Name: Zolpidem  Tartrate Oral Tablet 5 MG] 45 tablet     Sig: TAKE 1/2 TABLET AT BEDTIME     Not Delegated - Psychiatry:  Anxiolytics/Hypnotics Failed - 03/31/2024  5:06 PM      Failed - This refill cannot be delegated      Failed - Urine Drug Screen completed in last 360 days      Passed - Valid encounter within last 6 months    Recent Outpatient Visits           1 month ago Benign hypertension with CKD (chronic kidney disease) stage III [facility])   [place] [facility] [place], Kayleen [place], [place]

## 2024-05-04 ENCOUNTER — Other Ambulatory Visit: Payer: Self-pay | Admitting: Family Medicine

## 2024-05-04 DIAGNOSIS — G8929 Other chronic pain: Secondary | ICD-10-CM

## 2024-05-04 DIAGNOSIS — M150 Primary generalized (osteo)arthritis: Secondary | ICD-10-CM

## 2024-05-06 NOTE — Telephone Encounter (Signed)
 Requested medication (s) are due for refill today: yes  Requested medication (s) are on the active medication list: yes  Last refill:  11/07/23  Future visit scheduled: yes  Notes to clinic:  Unable to refill per protocol, cannot delegate.      Requested Prescriptions  Pending Prescriptions Disp Refills   traMADol  (ULTRAM ) 50 MG tablet [Pharmacy Med Name: TRAMADOL  HYDROCHLORIDE 50 MG Oral Tablet] 120 tablet     Sig: TAKE 1 TABLET FOUR TIMES DAILY     Not Delegated - Analgesics:  Opioid Agonists Failed - 05/06/2024  9:50 AM      Failed - This refill cannot be delegated      Failed - Urine Drug Screen completed in last 360 days      Passed - Valid encounter within last 3 months    Recent Outpatient Visits           2 months ago Benign hypertension with CKD (chronic kidney disease) stage III [facility])   [place] [facility] [place], Marsa PARAS, [place]

## 2024-06-08 ENCOUNTER — Other Ambulatory Visit: Payer: Self-pay | Admitting: Family Medicine

## 2024-06-08 DIAGNOSIS — I129 Hypertensive chronic kidney disease with stage 1 through stage 4 chronic kidney disease, or unspecified chronic kidney disease: Secondary | ICD-10-CM

## 2024-06-09 NOTE — Telephone Encounter (Signed)
 Requested Prescriptions  Pending Prescriptions Disp Refills   amLODipine  (NORVASC ) 5 MG tablet [Pharmacy Med Name: AMLODIPINE  BESYLATE 5 MG Oral Tablet] 90 tablet 0    Sig: TAKE 1 TABLET EVERY DAY     Cardiovascular: Calcium Channel Blockers 2 Passed - 06/09/2024 12:25 PM      Passed - Last BP in normal range    BP Readings from Last 1 Encounters:  02/14/24 122/64         Passed - Last Heart Rate in normal range    Pulse Readings from Last 1 Encounters:  02/14/24 62         Passed - Valid encounter within last 6 months    Recent Outpatient Visits           3 months ago Benign hypertension with CKD (chronic kidney disease) stage III [facility])   [place] [facility] [place], Marsa PARAS, [place]

## 2024-06-30 ENCOUNTER — Other Ambulatory Visit: Payer: Self-pay | Admitting: Family Medicine

## 2024-06-30 DIAGNOSIS — F5101 Primary insomnia: Secondary | ICD-10-CM

## 2024-07-01 NOTE — Telephone Encounter (Signed)
 Requested medication (s) are due for refill today: no  Requested medication (s) are on the active medication list: yes  Last refill:  04/01/24 #45 1 RF  Future visit scheduled: yes  Notes to clinic:  med not delegated to NT to RF   Requested Prescriptions  Pending Prescriptions Disp Refills   zolpidem  (AMBIEN ) 5 MG tablet [Pharmacy Med Name: ZOLPIDEM  TARTRATE 5 MG Oral Tablet] 45 tablet     Sig: TAKE 1/2 TABLET AT BEDTIME     Not Delegated - Psychiatry:  Anxiolytics/Hypnotics Failed - 07/01/2024  1:05 PM      Failed - This refill cannot be delegated      Failed - Urine Drug Screen completed in last 360 days      Passed - Valid encounter within last 6 months    Recent Outpatient Visits           4 months ago Benign hypertension with CKD (chronic kidney disease) stage III [facility])   [place] [facility] [place], Marsa PARAS, [place]

## 2024-08-21 ENCOUNTER — Other Ambulatory Visit

## 2024-08-21 ENCOUNTER — Other Ambulatory Visit: Payer: Self-pay | Admitting: Family Medicine

## 2024-08-21 DIAGNOSIS — Z0000 Encounter for general adult medical examination without abnormal findings: Secondary | ICD-10-CM

## 2024-08-21 DIAGNOSIS — F5101 Primary insomnia: Secondary | ICD-10-CM | POA: Diagnosis not present

## 2024-08-21 DIAGNOSIS — R7309 Other abnormal glucose: Secondary | ICD-10-CM

## 2024-08-21 DIAGNOSIS — E7800 Pure hypercholesterolemia, unspecified: Secondary | ICD-10-CM | POA: Diagnosis not present

## 2024-08-21 DIAGNOSIS — N401 Enlarged prostate with lower urinary tract symptoms: Secondary | ICD-10-CM | POA: Diagnosis not present

## 2024-08-21 DIAGNOSIS — I129 Hypertensive chronic kidney disease with stage 1 through stage 4 chronic kidney disease, or unspecified chronic kidney disease: Secondary | ICD-10-CM | POA: Diagnosis not present

## 2024-08-21 DIAGNOSIS — N138 Other obstructive and reflux uropathy: Secondary | ICD-10-CM

## 2024-08-21 DIAGNOSIS — N183 Chronic kidney disease, stage 3 unspecified: Secondary | ICD-10-CM | POA: Diagnosis not present

## 2024-08-21 DIAGNOSIS — N400 Enlarged prostate without lower urinary tract symptoms: Secondary | ICD-10-CM | POA: Diagnosis not present

## 2024-08-22 LAB — PSA: PSA: 1.09 ng/mL (ref <=4.00)

## 2024-08-22 LAB — COMPREHENSIVE METABOLIC PANEL WITH GFR
AG Ratio: 1.5 (calc) (ref 1.0–2.5)
ALT: 10 U/L (ref 9–46)
AST: 15 U/L (ref 10–35)
Albumin: 4.2 g/dL (ref 3.6–5.1)
Alkaline phosphatase (APISO): 78 U/L (ref 35–144)
BUN: 24 mg/dL (ref 7–25)
CO2: 24 mmol/L (ref 20–32)
Calcium: 9.2 mg/dL (ref 8.6–10.3)
Chloride: 105 mmol/L (ref 98–110)
Creat: 1.16 mg/dL (ref 0.70–1.28)
Globulin: 2.8 g/dL (ref 1.9–3.7)
Glucose, Bld: 92 mg/dL (ref 65–99)
Potassium: 4.2 mmol/L (ref 3.5–5.3)
Sodium: 137 mmol/L (ref 135–146)
Total Bilirubin: 0.5 mg/dL (ref 0.2–1.2)
Total Protein: 7 g/dL (ref 6.1–8.1)
eGFR: 65 mL/min/1.73m2 (ref >=60)

## 2024-08-22 LAB — CBC WITH DIFFERENTIAL/PLATELET
Absolute Lymphocytes: 1309 {cells}/uL (ref 850–3900)
Absolute Monocytes: 380 {cells}/uL (ref 200–950)
Basophils Absolute: 39 {cells}/uL (ref 0–200)
Basophils Relative: 0.7 %
Eosinophils Absolute: 231 {cells}/uL (ref 15–500)
Eosinophils Relative: 4.2 %
HCT: 40.1 % (ref 38.5–50.0)
Hemoglobin: 13.2 g/dL (ref 13.2–17.1)
MCH: 30 pg (ref 27.0–33.0)
MCHC: 32.9 g/dL (ref 32.0–36.0)
MCV: 91.1 fL (ref 80.0–100.0)
MPV: 10.5 fL (ref 7.5–12.5)
Monocytes Relative: 6.9 %
Neutro Abs: 3542 {cells}/uL (ref 1500–7800)
Neutrophils Relative %: 64.4 %
Platelets: 250 Thousand/uL (ref 140–400)
RBC: 4.4 Million/uL (ref 4.20–5.80)
RDW: 12.9 % (ref 11.0–15.0)
Total Lymphocyte: 23.8 %
WBC: 5.5 Thousand/uL (ref 3.8–10.8)

## 2024-08-22 LAB — LIPID PANEL
Cholesterol: 182 mg/dL (ref <200)
HDL: 74 mg/dL (ref >=40)
LDL Cholesterol (Calc): 94 mg/dL
Non-HDL Cholesterol (Calc): 108 mg/dL (ref <130)
Total CHOL/HDL Ratio: 2.5 (calc) (ref <5.0)
Triglycerides: 55 mg/dL (ref <150)

## 2024-08-22 LAB — HEMOGLOBIN A1C
Hgb A1c MFr Bld: 5.7 % — ABNORMAL HIGH (ref <5.7)
Mean Plasma Glucose: 117 mg/dL
eAG (mmol/L): 6.5 mmol/L

## 2024-08-22 NOTE — Telephone Encounter (Signed)
 Requested Prescriptions  Pending Prescriptions Disp Refills   amLODipine  (NORVASC ) 5 MG tablet [Pharmacy Med Name: AMLODIPINE  BESYLATE 5 MG Oral Tablet] 90 tablet 0    Sig: TAKE 1 TABLET EVERY DAY     Cardiovascular: Calcium Channel Blockers 2 Failed - 08/22/2024  7:41 AM      Failed - Valid encounter within last 6 months    Recent Outpatient Visits           6 months ago Benign hypertension with CKD (chronic kidney disease) stage III [facility])   [facility] [facility], Marsa PARAS, DO              Passed - Last BP in normal range    BP Readings from Last 1 Encounters:  02/14/24 122/64         Passed - Last Heart Rate in normal range    Pulse Readings from Last 1 Encounters:  02/14/24 62

## 2024-08-28 ENCOUNTER — Encounter: Admitting: Family Medicine

## 2024-08-31 ENCOUNTER — Ambulatory Visit (INDEPENDENT_AMBULATORY_CARE_PROVIDER_SITE_OTHER): Admitting: Family Medicine

## 2024-08-31 ENCOUNTER — Encounter: Payer: Self-pay | Admitting: Family Medicine

## 2024-08-31 VITALS — BP 150/72 | HR 62 | Ht 68.0 in | Wt 159.0 lb

## 2024-08-31 DIAGNOSIS — Z23 Encounter for immunization: Secondary | ICD-10-CM

## 2024-08-31 DIAGNOSIS — F5101 Primary insomnia: Secondary | ICD-10-CM

## 2024-08-31 DIAGNOSIS — N183 Chronic kidney disease, stage 3 unspecified: Secondary | ICD-10-CM | POA: Diagnosis not present

## 2024-08-31 DIAGNOSIS — Z0000 Encounter for general adult medical examination without abnormal findings: Secondary | ICD-10-CM

## 2024-08-31 DIAGNOSIS — M150 Primary generalized (osteo)arthritis: Secondary | ICD-10-CM

## 2024-08-31 DIAGNOSIS — I129 Hypertensive chronic kidney disease with stage 1 through stage 4 chronic kidney disease, or unspecified chronic kidney disease: Secondary | ICD-10-CM | POA: Diagnosis not present

## 2024-08-31 DIAGNOSIS — E7800 Pure hypercholesterolemia, unspecified: Secondary | ICD-10-CM

## 2024-08-31 MED ORDER — LOVASTATIN 20 MG PO TABS: 20.0000 mg | ORAL_TABLET | Freq: Every day | ORAL | 3 refills | Status: AC

## 2024-08-31 MED ORDER — GABAPENTIN 100 MG PO CAPS: 100.0000 mg | ORAL_CAPSULE | Freq: Three times a day (TID) | ORAL | 1 refills | Status: AC

## 2024-08-31 MED ORDER — AMLODIPINE BESYLATE 5 MG PO TABS: 5.0000 mg | ORAL_TABLET | Freq: Every day | ORAL | 0 refills | Status: AC

## 2024-08-31 MED ORDER — MELOXICAM 7.5 MG PO TABS
ORAL_TABLET | ORAL | 3 refills | Status: AC
Start: 2024-08-31 — End: ?

## 2024-08-31 NOTE — Progress Notes (Signed)
 Subjective:    Patient ID: Jerry Simmons, male    DOB: 1946-07-31, 78 y.o.   MRN: [id number]  Jerry Simmons is a 78 y.o. male presenting on 08/31/2024 for Annual Exam   HPI  Discussed the use of AI scribe software for clinical note transcription with the patient, who gave verbal consent to proceed.  History of Present Illness   Jerry Simmons is a 78 year old male who presents for an annual physical exam.  Elevated A1c Elevated A1c at 5.7, prior 5.8 1 year ago, he had a 5.2 but otherwise not a concern He does reduce some sugar intake but not overhauling diet   HYPERLIPIDEMIA: - Reports no concerns. Last lipid panel 08/2024, controlled  LDL to 94 from 100 - Currently taking Lovastatin  20mg , tolerating well without side effects or myalgias   Osteoarthritis, Multiple joints (knees, hands, feet) / Chronic Knee Pain Chronic problem with arthritis Followed by [facility] Dr Marchia Failed steroid injections Completed PT and Synvisc injections On controlled subs agreement opoid for Tramadol , doing well, taking QID rarely takes a half or skip a dose. - Taking Meloxicam  7.5 AS NEEDED on most days, and topical Diclofenac  and Tylenol  -Also taking Glucosamine 1500mg  and Chondroitin 1200mg  twice daily Denies redness swelling worsening pain, numbness tingling weakness other joint problem.   CHRONIC HTN CKD II Elevated BP in office. Home readings limited lately. Creatinine lab improved, stable eGFR>60 Current Meds - Amlodipine  5mg  daily, Lisinopril  40mg  daily Reports good compliance, took meds today. Tolerating well, w/o complaints. Denies CP, dyspnea, HA, edema, dizziness / lightheadedness   Insomnia, primary Improved on Ambien  half dose of 5mg  now 2.5mg  dose nightly AS NEEDED   Health Maintenance:  PSA 1.09, improved from prior 1.4 to 1.5  Flu Shot today  Prevnar-20 02/2024  Shingles vaccine previously 2019  COVID Booster last 2024  RSV done  2023  Cologuard 08/10/22 negative, repeat 3 years if interested 2026     02/14/2024    3:49 PM 08/16/2023    3:08 PM 02/15/2023    3:38 PM  Depression screen PHQ 2/9  Decreased Interest 0 0 0  Down, Depressed, Hopeless 0 0 0  PHQ - 2 Score 0 0 0  Altered sleeping  0   Tired, decreased energy  0   Change in appetite  0   Feeling bad or failure about yourself   0   Trouble concentrating  0   Moving slowly or fidgety/restless  0   Suicidal thoughts  0   PHQ-9 Score  0    Difficult doing work/chores  Not difficult at all      Data saved with a previous flowsheet row definition       02/14/2024    3:50 PM 08/16/2023    3:08 PM 02/15/2023    3:38 PM 08/03/2022    9:33 AM  GAD 7 : Generalized Anxiety Score  Nervous, Anxious, on Edge 0 0 0 0  Control/stop worrying 0 0 0 0  Worry too much - different things 0 0 0 0  Trouble relaxing 0 0 0 0  Restless 0 0 0 0  Easily annoyed or irritable 0 0 0 0  Afraid - awful might happen 0 0 0 0  Total GAD 7 Score 0 0 0 0  Anxiety Difficulty Not difficult at all   Not difficult at all     Past Medical History:  Diagnosis Date   Arthritis    Past Surgical History:  Procedure Laterality Date   EYE SURGERY  12/10/2018   Right eye   Social History   Socioeconomic History   Marital status: Married    Spouse name: Not on file   Number of children: Not on file   Years of education: Not on file   Highest education level: 12th grade  Occupational History   Not on file  Tobacco Use   Smoking status: Former    Current packs/day: 0.00    Average packs/day: 3.0 packs/day for 20.0 years (60.0 ttl pk-yrs)    Types: Cigarettes    Start date: 09/16/1965    Quit date: 09/16/1985    Years since quitting: 38.9   Smokeless tobacco: Former  Substance and Sexual Activity   Alcohol use: Yes    Alcohol/week: 1.0 standard drink of alcohol    Types: 1 Glasses of wine per week    Comment: Only has wine on occassional.    Drug use: No   Sexual  activity: Not on file  Other Topics Concern   Not on file  Social History Narrative   Not on file   Social Drivers of Health   Financial Resource Strain: Low Risk  (08/27/2024)   Overall Financial Resource Strain (CARDIA)    Difficulty of Paying Living Expenses: Not hard at all  Food Insecurity: No Food Insecurity (08/27/2024)   Hunger Vital Sign    Worried About Running Out of Food in the Last Year: Never true    Ran Out of Food in the Last Year: Never true  Transportation Needs: No Transportation Needs (08/27/2024)   PRAPARE - Administrator, Civil Service (Medical): No    Lack of Transportation (Non-Medical): No  Physical Activity: Sufficiently Active (08/27/2024)   Exercise Vital Sign    Days of Exercise per Week: 6 days    Minutes of Exercise per Session: 150+ min  Stress: No Stress Concern Present (08/27/2024)   [employer] of Occupational Health - Occupational Stress Questionnaire    Feeling of Stress: Not at all  Social Connections: Socially Integrated (08/27/2024)   Social Connection and Isolation Panel    Frequency of Communication with Friends and Family: More than three times a week    Frequency of Social Gatherings with Friends and Family: More than three times a week    Attends Religious Services: More than 4 times per year    Active Member of [employer] or Organizations: Yes    Attends Engineer, Structural: More than 4 times per year    Marital Status: Married  Catering Manager Violence: Not on file   History reviewed. No pertinent family history. Current Outpatient Medications on File Prior to Visit  Medication Sig   finasteride  (PROSCAR ) 5 MG tablet Take 1 tablet (5 mg total) by mouth daily.   lisinopril  (ZESTRIL ) 40 MG tablet Take 1 tablet (40 mg total) by mouth daily.   traMADol  (ULTRAM ) 50 MG tablet Take 1 tablet (50 mg total) by mouth 4 (four) times daily.   zolpidem  (AMBIEN ) 5 MG tablet TAKE 1/2 TABLET AT BEDTIME   No current  facility-administered medications on file prior to visit.    Review of Systems  Constitutional:  Negative for activity change, appetite change, chills, diaphoresis, fatigue and fever.  HENT:  Negative for congestion and hearing loss.   Eyes:  Negative for visual disturbance.  Respiratory:  Negative for cough, chest tightness, shortness of breath and wheezing.   Cardiovascular:  Negative for chest pain, palpitations and  leg swelling.  Gastrointestinal:  Negative for abdominal pain, constipation, diarrhea, nausea and vomiting.  Genitourinary:  Negative for dysuria, frequency and hematuria.  Musculoskeletal:  Positive for arthralgias. Negative for neck pain.  Skin:  Negative for rash.  Neurological:  Negative for dizziness, weakness, light-headedness, numbness and headaches.  Hematological:  Negative for adenopathy.  Psychiatric/Behavioral:  Negative for behavioral problems, dysphoric mood and sleep disturbance.    Per HPI unless specifically indicated above     Objective:    BP (!) 150/72 (BP Location: Left Arm, Cuff Size: Normal)   Pulse 62   Ht 5' 8 (1.727 m)   Wt 159 lb (72.1 kg)   SpO2 96%   BMI 24.18 kg/m   Wt Readings from Last 3 Encounters:  08/31/24 159 lb (72.1 kg)  02/14/24 156 lb (70.8 kg)  08/16/23 155 lb (70.3 kg)    Physical Exam Vitals and nursing note reviewed.  Constitutional:      General: He is not in acute distress.    Appearance: He is well-developed. He is not diaphoretic.     Comments: Well-appearing, comfortable, cooperative  HENT:     Head: Normocephalic and atraumatic.  Eyes:     General:        Right eye: No discharge.        Left eye: No discharge.     Conjunctiva/sclera: Conjunctivae normal.     Pupils: Pupils are equal, round, and reactive to light.  Neck:     Thyroid: No thyromegaly.     Vascular: No carotid bruit.  Cardiovascular:     Rate and Rhythm: Normal rate and regular rhythm.     Pulses: Normal pulses.     Heart sounds:  Normal heart sounds. No murmur heard. Pulmonary:     Effort: Pulmonary effort is normal. No respiratory distress.     Breath sounds: Normal breath sounds. No wheezing or rales.  Abdominal:     General: Bowel sounds are normal. There is no distension.     Palpations: Abdomen is soft. There is no mass.     Tenderness: There is no abdominal tenderness.  Musculoskeletal:        General: No tenderness. Normal range of motion.     Cervical back: Normal range of motion and neck supple.     Right lower leg: No edema.     Left lower leg: No edema.     Comments: Upper / Lower Extremities: - Normal muscle tone, strength bilateral upper extremities 5/5, lower extremities 5/5  Lymphadenopathy:     Cervical: No cervical adenopathy.  Skin:    General: Skin is warm and dry.     Findings: No erythema or rash.  Neurological:     Mental Status: He is alert and oriented to person, place, and time.     Comments: Distal sensation intact to light touch all extremities  Psychiatric:        Mood and Affect: Mood normal.        Behavior: Behavior normal.        Thought Content: Thought content normal.     Comments: Well groomed, good eye contact, normal speech and thoughts     Results for orders placed or performed in visit on 08/21/24  Comprehensive metabolic panel with GFR   Collection Time: 08/21/24  8:20 AM  Result Value Ref Range   Glucose, Bld 92 65 - 99 mg/dL   BUN 24 7 - 25 mg/dL   Creat 8.83 9.29 - 8.71  mg/dL   eGFR 65 > OR = 60 fO/fpw/8.26f7   BUN/Creatinine Ratio SEE NOTE: 6 - 22 (calc)   Sodium 137 135 - 146 mmol/L   Potassium 4.2 3.5 - 5.3 mmol/L   Chloride 105 98 - 110 mmol/L   CO2 24 20 - 32 mmol/L   Calcium 9.2 8.6 - 10.3 mg/dL   Total Protein 7.0 6.1 - 8.1 g/dL   Albumin 4.2 3.6 - 5.1 g/dL   Globulin 2.8 1.9 - 3.7 g/dL (calc)   AG Ratio 1.5 1.0 - 2.5 (calc)   Total Bilirubin 0.5 0.2 - 1.2 mg/dL   Alkaline phosphatase (APISO) 78 35 - 144 U/L   AST 15 10 - 35 U/L   ALT 10 9 -  46 U/L  PSA   Collection Time: 08/21/24  8:20 AM  Result Value Ref Range   PSA 1.09 < OR = 4.00 ng/mL  CBC with Differential/Platelet   Collection Time: 08/21/24  8:20 AM  Result Value Ref Range   WBC 5.5 3.8 - 10.8 Thousand/uL   RBC 4.40 4.20 - 5.80 Million/uL   Hemoglobin 13.2 13.2 - 17.1 g/dL   HCT 59.8 61.4 - 49.9 %   MCV 91.1 80.0 - 100.0 fL   MCH 30.0 27.0 - 33.0 pg   MCHC 32.9 32.0 - 36.0 g/dL   RDW 87.0 88.9 - 84.9 %   Platelets 250 140 - 400 Thousand/uL   MPV 10.5 7.5 - 12.5 fL   Neutro Abs 3,542 1,500 - 7,800 cells/uL   Absolute Lymphocytes 1,309 850 - 3,900 cells/uL   Absolute Monocytes 380 200 - 950 cells/uL   Eosinophils Absolute 231 15 - 500 cells/uL   Basophils Absolute 39 0 - 200 cells/uL   Neutrophils Relative % 64.4 %   Total Lymphocyte 23.8 %   Monocytes Relative 6.9 %   Eosinophils Relative 4.2 %   Basophils Relative 0.7 %  Hemoglobin A1c   Collection Time: 08/21/24  8:20 AM  Result Value Ref Range   Hgb A1c MFr Bld 5.7 (H) <5.7 %   Mean Plasma Glucose 117 mg/dL   eAG (mmol/L) 6.5 mmol/L  Lipid panel   Collection Time: 08/21/24  8:20 AM  Result Value Ref Range   Cholesterol 182 <200 mg/dL   HDL 74 > OR = 40 mg/dL   Triglycerides 55 <849 mg/dL   LDL Cholesterol (Calc) 94 mg/dL (calc)   Total CHOL/HDL Ratio 2.5 <5.0 (calc)   Non-HDL Cholesterol (Calc) 108 <130 mg/dL (calc)      Assessment & Plan:   Problem List Items Addressed This Visit     Benign hypertension with CKD (chronic kidney disease) stage III (HCC)   Relevant Medications   lovastatin  (MEVACOR ) 20 MG tablet   amLODipine  (NORVASC ) 5 MG tablet   Hypercholesteremia   Relevant Medications   lovastatin  (MEVACOR ) 20 MG tablet   amLODipine  (NORVASC ) 5 MG tablet   Osteoarthritis of multiple joints   Relevant Medications   gabapentin (NEURONTIN) 100 MG capsule   meloxicam  (MOBIC ) 7.5 MG tablet   Primary insomnia   Other Visit Diagnoses       Annual physical exam    -  Primary      Flu vaccine need       Relevant Orders   Flu vaccine HIGH DOSE PF(Fluzone Trivalent) (Completed)        Updated Health Maintenance information Reviewed recent lab results with patient Encouraged improvement to lifestyle with diet and exercise Goal of weight loss  Adult Wellness Visit Annual wellness visit conducted. Blood pressure slightly elevated. Labs reviewed: A1c mildly elevated, cholesterol improved, kidney function stable, PSA normal. - Continue current medications and lifestyle modifications. - Monitor blood pressure at home and report if consistently above 150 mmHg. - Scheduled follow-up in six months.  Hypertension with chronic kidney disease Blood pressure slightly elevated. Current medications include lisinopril  and amlodipine . Discussed potential side effects of increasing amlodipine . - Monitor blood pressure at home and report if consistently above 150 mmHg. - Continue current antihypertensive regimen. Amlodipine  5mg  daily, Lisinopril  40mg  daily  Chronic pain due to generalized osteoarthritis (mainly knees) Chronic knee pain. Current management includes tramadol  and meloxicam . Discussed increasing meloxicam  with risk of kidney decline. Considered gabapentin for nerve pain. Discussed stronger alternatives but advised against due to tolerance risk. - Continue tramadol  50mg  FOUR TIMES A DAY up to 200 mg per day. - On Meloxicam  7.5mg  daily, limited due to CKD risk with NSAID - New therapy today - Initiated gabapentin at bedtime, with potential to increase to three times daily as tolerated. - Monitor pain levels and adjust gabapentin dosage as needed. - Consider future change Tramadol  > Vicodin Norco however prefer to avoid this type of escalation if possible. - Consider referral to pain management if current regimen becomes ineffective.  Hypercholesterolemia Cholesterol levels improved. Current medication is lovastatin . - Continue lovastatin  20 mg  daily.  Prediabetes A1c mildly elevated. Previous A1c was lower. - Continue monitoring blood glucose levels.  Insomnia Managed with zolpidem . Discussed pill size variation due to different manufacturers. - Continue zolpidem  as prescribed.  Immunization management Up to date on vaccinations. COVID booster not received this year but not required. - Continue annual flu vaccinations.          Orders Placed This Encounter  Procedures   Flu vaccine HIGH DOSE PF(Fluzone Trivalent)    Meds ordered this encounter  Medications   lovastatin  (MEVACOR ) 20 MG tablet    Sig: Take 1 tablet (20 mg total) by mouth daily.    Dispense:  90 tablet    Refill:  3   amLODipine  (NORVASC ) 5 MG tablet    Sig: Take 1 tablet (5 mg total) by mouth daily.    Dispense:  90 tablet    Refill:  0   gabapentin (NEURONTIN) 100 MG capsule    Sig: Take 1 capsule (100 mg total) by mouth 3 (three) times daily.    Dispense:  270 capsule    Refill:  1   meloxicam  (MOBIC ) 7.5 MG tablet    Sig: TAKE 1 TABLET EVERY DAY AS NEEDED FOR PAIN    Dispense:  90 tablet    Refill:  3     Follow up plan: Return in about 6 months (around 02/28/2025) for 6 month PreDM A1c, Chronic Pain, BP.  Marsa Officer, DO [facility] 08/31/2024, 4:04 PM

## 2024-08-31 NOTE — Patient Instructions (Addendum)
 Thank you for coming to the office today.  BP still mildly elevated Goal to keep a close watch on BP readings at home If consistently >150 on the top number, message or call and I can discuss options on what to do next, maybe change or upgrade a med.  Keep current meds  Tramadol  daily for pain still, up to 4 per day = 120 per month No change  I will add the Gabapentin for general pain / nerve pain. This can dial down pain in addition to your other meds.  Start Gabapentin 100mg  capsules, take at night for 2-3 nights only, and then increase to 2 times a day for a few days, and then may increase to 3 times a day, it may make you drowsy, if helps significantly at night only, then you can increase instead to 3 capsules at night, instead of 3 times a day - In the future if needed, we can significantly increase the dose if tolerated well, some common doses are 300mg  three times a day up to 600mg  three times a day, usually it takes several weeks or months to get to higher doses   CONSIDER Coronary Calcium Score Cardiac CT Scan. This is a screening test for patients aged 5-50+ with cardiovascular risk factors or who are healthy but would be interested in Cardiovascular Screening for heart disease. Even if there is a family history of heart disease, this imaging can be useful. Typically it can be done every 5+ years or at a different timeline we agree on  The scan will look at the chest and mainly focus on the heart and identify early signs of calcium build up or blockages within the heart arteries. It is not 100% accurate for identifying blockages or heart disease, but it is useful to help us  predict who may have some early changes or be at risk in the future for a heart attack or cardiovascular problem.  The results are reviewed by a Cardiologist and they will document the results. It should become available on MyChart. Typically the results are divided into percentiles based on other patients of  the same demographic and age. So it will compare your risk to others similar to you. If you have a higher score >99 or higher percentile >75%tile, it is recommended to consider Statin cholesterol therapy and or referral to Cardiologist. I will try to help explain your results and if we have questions we can contact the Cardiologist.  You will be contacted for scheduling. Usually it is done at any imaging facility through [facility], [facility] or [facility].  The cost is $99 flat fee total and it does not go through insurance, so no authorization is required.   Please schedule a Follow-up Appointment to: Return in about 6 months (around 02/28/2025) for 6 month PreDM A1c, Chronic Pain, BP.  If you have any other questions or concerns, please feel free to call the office or send a message through MyChart. You may also schedule an earlier appointment if necessary.  Additionally, you may be receiving a survey about your experience at our office within a few days to 1 week by e-mail or mail. We value your feedback.  Marsa Officer, DO [facility], [place]

## 2024-10-21 ENCOUNTER — Other Ambulatory Visit: Payer: Self-pay | Admitting: Family Medicine

## 2024-10-21 DIAGNOSIS — G8929 Other chronic pain: Secondary | ICD-10-CM

## 2024-10-21 DIAGNOSIS — M150 Primary generalized (osteo)arthritis: Secondary | ICD-10-CM

## 2024-10-22 NOTE — Telephone Encounter (Signed)
 Requested medications are due for refill today.  Due for refill in 2 weeks  Requested medications are on the active medications list.  yes  Last refill. 05/06/2024 #120 5 rf  Future visit scheduled.   yes  Notes to clinic.  Refill not delegated.    Requested Prescriptions  Pending Prescriptions Disp Refills   traMADol  (ULTRAM ) 50 MG tablet [Pharmacy Med Name: TRAMADOL  HYDROCHLORIDE 50 MG Oral Tablet] 120 tablet     Sig: TAKE 1 TABLET FOUR TIMES DAILY     Not Delegated - Analgesics:  Opioid Agonists Failed - 10/22/2024  2:42 PM      Failed - This refill cannot be delegated      Failed - Urine Drug Screen completed in last 360 days      Passed - Valid encounter within last 3 months    Recent Outpatient Visits           1 month ago Annual physical exam   [place] [facility] [place], Marsa PARAS, DO   8 months ago Benign hypertension with CKD (chronic kidney disease) stage III [facility])   [place] [facility] [place], Marsa PARAS, [place]

## 2024-10-26 ENCOUNTER — Encounter: Payer: Self-pay | Admitting: Family Medicine

## 2024-11-17 ENCOUNTER — Other Ambulatory Visit: Payer: Self-pay | Admitting: Family Medicine

## 2024-11-17 DIAGNOSIS — I129 Hypertensive chronic kidney disease with stage 1 through stage 4 chronic kidney disease, or unspecified chronic kidney disease: Secondary | ICD-10-CM

## 2024-11-17 DIAGNOSIS — N401 Enlarged prostate with lower urinary tract symptoms: Secondary | ICD-10-CM

## 2024-11-19 NOTE — Telephone Encounter (Signed)
 Too soon for refill, last refill 02/14/24 for 90 and 3  refills.  Requested Prescriptions  Pending Prescriptions Disp Refills   lisinopril  (ZESTRIL ) 40 MG tablet [Pharmacy Med Name: LISINOPRIL  40 MG Oral Tablet] 90 tablet 3    Sig: TAKE 1 TABLET EVERY DAY     Cardiovascular:  ACE Inhibitors Failed - 11/19/2024 11:27 AM      Failed - Last BP in normal range    BP Readings from Last 1 Encounters:  08/31/24 (!) 150/72         Passed - Cr in normal range and within 180 days    Creat  Date Value Ref Range Status  08/21/2024 1.16 0.70 - 1.28 mg/dL Final         Passed - K in normal range and within 180 days    Potassium  Date Value Ref Range Status  08/21/2024 4.2 3.5 - 5.3 mmol/L Final         Passed - Patient is not pregnant      Passed - Valid encounter within last 6 months    Recent Outpatient Visits           2 months ago Annual physical exam   [facility] [place], Marsa PARAS, DO   9 months ago Benign hypertension with CKD (chronic kidney disease) stage III [facility])   [place] [facility] [place], Marsa PARAS, DO               finasteride  (PROSCAR ) 5 MG tablet [Pharmacy Med Name: FINASTERIDE  5 MG Oral Tablet] 90 tablet 3    Sig: TAKE 1 TABLET EVERY DAY     Urology: 5-alpha Reductase Inhibitors Passed - 11/19/2024 11:27 AM      Passed - PSA in normal range and within 360 days    PSA  Date Value Ref Range Status  08/21/2024 1.09 < OR = 4.00 ng/mL Final    Comment:    The total PSA value from this assay system is  standardized against the WHO standard. The test  result will be approximately 20% lower when compared  to the equimolar-standardized total PSA (Beckman  Coulter). Comparison of serial PSA results should be  interpreted with this fact in mind. . This test was performed using the Siemens  chemiluminescent method. Values obtained from  different assay methods cannot be used interchangeably. PSA levels,  regardless of value, should not be interpreted as absolute evidence of the presence or absence of disease.   12/14/2014 2.4  Final   Prostate Specific Ag, Serum  Date Value Ref Range Status  10/26/2015 2.5 0.0 - 4.0 ng/mL Final    Comment:    Roche ECLIA methodology. According to the American Urological Association, Serum PSA should decrease and remain at undetectable levels after radical prostatectomy. The AUA defines biochemical recurrence as an initial PSA value 0.2 ng/mL or greater followed by a subsequent confirmatory PSA value 0.2 ng/mL or greater. Values obtained with different assay methods or kits cannot be used interchangeably. Results cannot be interpreted as absolute evidence of the presence or absence of malignant disease.          Passed - Valid encounter within last 12 months    Recent Outpatient Visits           2 months ago Annual physical exam    [facility] [place] Marsa PARAS, DO   9 months ago Benign hypertension with CKD (chronic kidney disease) stage  III [facility])   [place] [facility] [place], Marsa PARAS, [place]

## 2025-02-26 ENCOUNTER — Ambulatory Visit: Admitting: Family Medicine
